# Patient Record
Sex: Female | Born: 1950
Health system: Southern US, Community
[De-identification: ages and names within clinical notes are randomized; demographics above are authoritative.]

## PROBLEM LIST (undated history)

## (undated) DIAGNOSIS — E119 Type 2 diabetes mellitus without complications: Secondary | ICD-10-CM

## (undated) DIAGNOSIS — H269 Unspecified cataract: Secondary | ICD-10-CM

## (undated) DIAGNOSIS — I2510 Atherosclerotic heart disease of native coronary artery without angina pectoris: Secondary | ICD-10-CM

## (undated) DIAGNOSIS — E785 Hyperlipidemia, unspecified: Secondary | ICD-10-CM

## (undated) DIAGNOSIS — M797 Fibromyalgia: Secondary | ICD-10-CM

## (undated) DIAGNOSIS — I10 Essential (primary) hypertension: Secondary | ICD-10-CM

## (undated) DIAGNOSIS — F321 Major depressive disorder, single episode, moderate: Secondary | ICD-10-CM

## (undated) DIAGNOSIS — E782 Mixed hyperlipidemia: Secondary | ICD-10-CM

## (undated) DIAGNOSIS — M62838 Other muscle spasm: Secondary | ICD-10-CM

## (undated) DIAGNOSIS — F5101 Primary insomnia: Secondary | ICD-10-CM

## (undated) DIAGNOSIS — E1165 Type 2 diabetes mellitus with hyperglycemia: Secondary | ICD-10-CM

## (undated) DIAGNOSIS — I119 Hypertensive heart disease without heart failure: Secondary | ICD-10-CM

## (undated) DIAGNOSIS — J309 Allergic rhinitis, unspecified: Secondary | ICD-10-CM

## (undated) DIAGNOSIS — F419 Anxiety disorder, unspecified: Secondary | ICD-10-CM

## (undated) DIAGNOSIS — E039 Hypothyroidism, unspecified: Secondary | ICD-10-CM

## (undated) DIAGNOSIS — R0789 Other chest pain: Secondary | ICD-10-CM

## (undated) DIAGNOSIS — R0602 Shortness of breath: Secondary | ICD-10-CM

## (undated) HISTORY — DX: Type 2 diabetes mellitus without complications: E11.9

## (undated) HISTORY — DX: Essential (primary) hypertension: I10

## (undated) HISTORY — PX: BREAST BIOPSY: SHX20

## (undated) HISTORY — PX: ABDOMINAL HYSTERECTOMY: SHX81

## (undated) HISTORY — DX: Major depressive disorder, single episode, moderate: F32.1

## (undated) HISTORY — PX: BREAST SURGERY: SHX581

## (undated) HISTORY — DX: Primary insomnia: F51.01

## (undated) HISTORY — DX: Hyperlipidemia, unspecified: E78.5

## (undated) HISTORY — DX: Fibromyalgia: M79.7

## (undated) HISTORY — DX: Shortness of breath: R06.02

## (undated) HISTORY — DX: Other muscle spasm: M62.838

## (undated) HISTORY — DX: Unspecified cataract: H26.9

## (undated) HISTORY — DX: Atherosclerotic heart disease of native coronary artery without angina pectoris: I25.10

## (undated) HISTORY — DX: Mixed hyperlipidemia: E78.2

## (undated) HISTORY — PX: EYE SURGERY: SHX253

## (undated) HISTORY — DX: Hypothyroidism, unspecified: E03.9

## (undated) HISTORY — DX: Allergic rhinitis, unspecified: J30.9

## (undated) HISTORY — DX: Other chest pain: R07.89

## (undated) HISTORY — DX: Type 2 diabetes mellitus with hyperglycemia: E11.65

## (undated) HISTORY — PX: CARDIAC CATHETERIZATION: SHX172

## (undated) HISTORY — DX: Anxiety disorder, unspecified: F41.9

## (undated) HISTORY — PX: OTHER SURGICAL HISTORY: SHX169

## (undated) HISTORY — DX: Hypertensive heart disease without heart failure: I11.9

---

## 1998-09-10 ENCOUNTER — Ambulatory Visit (HOSPITAL_COMMUNITY): Admission: RE | Admit: 1998-09-10 | Discharge: 1998-09-10 | Payer: Self-pay | Admitting: General Surgery

## 1998-09-10 ENCOUNTER — Encounter: Payer: Self-pay | Admitting: General Surgery

## 1998-09-20 ENCOUNTER — Ambulatory Visit (HOSPITAL_COMMUNITY): Admission: RE | Admit: 1998-09-20 | Discharge: 1998-09-20 | Payer: Self-pay | Admitting: General Surgery

## 1999-02-19 ENCOUNTER — Inpatient Hospital Stay (HOSPITAL_COMMUNITY): Admission: EM | Admit: 1999-02-19 | Discharge: 1999-02-21 | Payer: Self-pay | Admitting: Emergency Medicine

## 2003-10-09 ENCOUNTER — Inpatient Hospital Stay (HOSPITAL_COMMUNITY): Admission: EM | Admit: 2003-10-09 | Discharge: 2003-10-13 | Payer: Self-pay | Admitting: Emergency Medicine

## 2003-11-12 ENCOUNTER — Encounter: Admission: RE | Admit: 2003-11-12 | Discharge: 2003-11-12 | Payer: Self-pay | Admitting: Cardiology

## 2003-11-14 ENCOUNTER — Ambulatory Visit (HOSPITAL_COMMUNITY): Admission: RE | Admit: 2003-11-14 | Discharge: 2003-11-15 | Payer: Self-pay

## 2003-12-14 ENCOUNTER — Ambulatory Visit (HOSPITAL_COMMUNITY): Admission: RE | Admit: 2003-12-14 | Discharge: 2003-12-14 | Payer: Self-pay | Admitting: Pulmonary Disease

## 2006-09-16 ENCOUNTER — Ambulatory Visit (HOSPITAL_COMMUNITY): Admission: RE | Admit: 2006-09-16 | Discharge: 2006-09-16 | Payer: Self-pay | Admitting: Allergy

## 2006-10-19 ENCOUNTER — Other Ambulatory Visit: Admission: RE | Admit: 2006-10-19 | Discharge: 2006-10-19 | Payer: Self-pay | Admitting: Interventional Radiology

## 2006-10-19 ENCOUNTER — Encounter (INDEPENDENT_AMBULATORY_CARE_PROVIDER_SITE_OTHER): Payer: Self-pay | Admitting: *Deleted

## 2006-10-19 ENCOUNTER — Encounter: Admission: RE | Admit: 2006-10-19 | Discharge: 2006-10-19 | Payer: Self-pay | Admitting: General Surgery

## 2009-05-30 ENCOUNTER — Other Ambulatory Visit: Admission: RE | Admit: 2009-05-30 | Discharge: 2009-05-30 | Payer: Self-pay | Admitting: Radiology

## 2009-07-30 ENCOUNTER — Ambulatory Visit (HOSPITAL_COMMUNITY): Admission: RE | Admit: 2009-07-30 | Discharge: 2009-07-30 | Payer: Self-pay | Admitting: General Surgery

## 2009-07-30 ENCOUNTER — Encounter: Admission: RE | Admit: 2009-07-30 | Discharge: 2009-07-30 | Payer: Self-pay | Admitting: General Surgery

## 2010-08-17 ENCOUNTER — Encounter: Payer: Self-pay | Admitting: Allergy

## 2010-10-12 LAB — BASIC METABOLIC PANEL
BUN: 19 mg/dL (ref 6–23)
CO2: 26 mEq/L (ref 19–32)
Calcium: 9.3 mg/dL (ref 8.4–10.5)
Chloride: 111 mEq/L (ref 96–112)
Creatinine, Ser: 0.92 mg/dL (ref 0.4–1.2)
GFR calc Af Amer: >60 mL/min (ref >60)
GFR calc non Af Amer: >60 mL/min (ref >60)
Glucose, Bld: 109 mg/dL — ABNORMAL HIGH (ref 70–99)
Potassium: 4.7 mEq/L (ref 3.5–5.1)
Sodium: 141 mEq/L (ref 135–145)

## 2010-10-12 LAB — DIFFERENTIAL
Basophils Absolute: 0 10*3/uL (ref 0.0–0.1)
Basophils Relative: 0 % (ref 0–1)
Eosinophils Absolute: 0.3 10*3/uL (ref 0.0–0.7)
Eosinophils Relative: 5 % (ref 0–5)
Lymphocytes Relative: 30 % (ref 12–46)
Lymphs Abs: 2 10*3/uL (ref 0.7–4.0)
Monocytes Absolute: 0.4 10*3/uL (ref 0.1–1.0)
Monocytes Relative: 6 % (ref 3–12)
Neutro Abs: 4 10*3/uL (ref 1.7–7.7)
Neutrophils Relative %: 59 % (ref 43–77)

## 2010-10-12 LAB — CBC
HCT: 41.2 % (ref 36.0–46.0)
Hemoglobin: 14.5 g/dL (ref 12.0–15.0)
MCHC: 35.1 g/dL (ref 30.0–36.0)
MCV: 90.7 fL (ref 78.0–100.0)
Platelets: 178 10*3/uL (ref 150–400)
RBC: 4.55 MIL/uL (ref 3.87–5.11)
RDW: 13.1 % (ref 11.5–15.5)
WBC: 6.7 10*3/uL (ref 4.0–10.5)

## 2010-12-12 NOTE — H&P (Signed)
NAME:  Meagan Molina, [name]                            ACCOUNT NO.:  [id number]   MEDICAL RECORD NO.:  [id number]                   PATIENT TYPE:  INP   LOCATION:                                       FACILITY:   PHYSICIAN:  Edward L. Juanetta Gosling, M.D.             DATE OF BIRTH:  December 28, 1950   DATE OF ADMISSION:  10/09/2003  DATE OF DISCHARGE:  10/09/2003                                HISTORY & PHYSICAL   COMBINATION HISTORY AND PHYSICAL/ DISCHARGE SUMMARY, SHORT-STAY RECORD:   DISCHARGE DIAGNOSES:  1. Chest pain.  2. Coronary artery occlusive disease.  3. Hypertension.  4. Hyperlipidemia.  5. Anxiety and depression.   HISTORY:  [name] is a patient that I have been seeing with a history of  hypertension, and who has been followed by [facility] for  what appears to be coronary artery occlusive disease.  She has been in her  usual state of fairly poor health at home with increasing stress.  She came  to the emergency room for evaluation, and was found to have chest discomfort  that was at least suggestive of coronary artery disease, but had cardiac  enzymes that were negative except for her troponin was slightly elevated.   PHYSICAL EXAMINATION:  GENERAL:  She appeared to be in some distress.  CHEST:  Showed some rhonchi bilaterally.  HEART:  Regular.  ABDOMEN:  Soft.  VITAL SIGNS:  Blood pressure 159/127 initially, pulse 86, respirations 24,  O2 saturations 100%.   HOSPITAL COURSE:  She was started on Lovenox but continued to have chest  pain.  Plans were made to move her to the intensive care unit and start her  on nitroglycerin when a bed became available at [facility], and she  was transferred there.  This had been the original plan to transfer to [facility] from the emergency room, but no beds were available, so a transfer  could not be made at that time.  She was transferred to the [facility] in fair condition and still with chest  pain and the concern  that she might have some sort of ongoing cardiac event.   PLANS:  At least tentatively to have her undergo a cardiac catheterization.     ___________________________________________                                         Oneal Deputy Juanetta Gosling, M.D.   ELH/MEDQ  D:  10/09/2003  T:  10/09/2003  Job:  [id number]

## 2010-12-12 NOTE — Discharge Summary (Signed)
NAME:  Meagan Molina, Meagan Molina                            ACCOUNT NO.:  [id number]   MEDICAL RECORD NO.:  [id number]                   PATIENT TYPE:  INP   LOCATION:  2025                                 FACILITY:  [facility]   PHYSICIAN:  Madaline Savage, M.D.             DATE OF BIRTH:  1950-08-20   DATE OF ADMISSION:  10/09/2003  DATE OF DISCHARGE:  10/13/2003                                 DISCHARGE SUMMARY   FINAL DIAGNOSES:  1. An acute inferior myocardial infarction.  2. Coronary artery disease with emergent percutaneous transluminal coronary     angioplasty stent to the left circumflex. The patient has residual     disease of left anterior descending stenosis.  3. Left ventricular dysfunction, ejection fraction of 45% to 50%.  4. Hypertension.  5. Hyperlipidemia.  6. Diabetes mellitus type 2, new diagnosis.  7. Fibromyalgia.  8. Anxiety, depression.   CONDITION ON DISCHARGE:  Improved.   PROCEDURE:  1. October 09, 2003 combined left heart catheterization by Dr. Yates Decamp.  2. October 09, 2003 PTCA and stent deployment of the Cypher stent to the     circumflex by Dr. Yates Decamp.   DISCHARGE MEDICATIONS:  1. Coreg 3.125 one twice a day for blood pressure and heart.  2. Avandia 4 mg one twice a day.  3. Glyburide 2.5 mg twice a day.  4. Tricor 48 mg daily.  5. Enteric coated aspirin 81 mg daily.  6. Plavix 75 mg one daily, must take for 6 months or stent may be blocked.  7. Prevacid 30 mg twice a day as before.  8. Lipitor 40 mg daily.  9. Estratab.  Discuss with your primary physician concerning need to     continue.  10.      Nitroglycerin 1/150 sublingual p.r.n. chest pain.   DISCHARGE INSTRUCTIONS:  1. No strenuous activity, no lifting over 10 pounds for three weeks, no     driving for one week and no work.  2. Low fat, low salt diabetic diet.  3. Wash cath site with soap and water, call if any bleeding, swelling or     drainage.  4. Expect to receive a telephone call to  arrange outpatient diabetic     education at [facility].  5. Follow up with Dr. Jacinto Halim in one week.  The office will call you Monday     for an appointment date and time.  Follow up with Dr. Juanetta Gosling concerning     your diabetes.   HISTORY OF PRESENT ILLNESS:  This is a 60 year old white female, history of  chest pain and catheterization in 1987 by Dr. [name] which was normal,  again in July, 2000.  Dr. Allyson Sabal did a cardiac cath revealing a 70% LAD  lesion.  She has a history of hypertension and hyperlipidemia.   In August, 2004, Cardiolite was done and Dr. Allyson Sabal felt  there was no  significant ischemia.   The patient developed some chest pain on October 08, 2003 when she was  visiting with her father who had had a recent stroke.  Then, on the morning  of October 09, 2003 she woke at 3 a.m. with substernal chest pain,  diaphoresis, nausea and shortness of breath.  She presented to the emergency  room where EKG showed no acute changes.  Her troponin was positive at 0.07.  Her symptoms are somewhat improved with nitroglycerin.  She was transferred  from [facility] to [facility] for further evaluation.   PAST MEDICAL HISTORY:  1. Cardiac as described.  2. Hypertension.  3. Fibromyalgia.  4. Hyperlipidemia.  5. Anxiety and depression.  6. Status post total abdominal hysterectomy.  7. History of goiter by thyroid scan in 2000.   CURRENT MEDICATIONS:  Aspirin 325 daily, Prevacid b.i.d., Estratest daily  and Lipitor 40 daily.   ALLERGIES:  SULFA.   SOCIAL HISTORY:  Please see history and physical.   FAMILY HISTORY:  Please see history and physical.   REVIEW OF SYSTEMS:  Please see history and physical.   PHYSICAL EXAMINATION:  VITAL SIGNS: Blood pressure initially is 86/52,  but  at discharge she was 98/62.  Pulse is 80, temperature 99.2. HEART:  Regular  rate and rhythm, S1 and S2.  CHEST:  Clear.  ABDOMEN: Soft and nontender.   EXTREMITIES:  Without hematoma.  On telemetry, sinus rhythm.   LABORATORY DATA:  Admitting labs, hemoglobin 16.1, hematocrit 46.7,  platelets 264.  WBC 9.4. Prior to discharge WBC 10.2, platelets 234.  Hemoglobin is 14.5.   Pro time on admission PT is 13.2, INR 1 and PTT 30.   Chemistries sodium 137, potassium 3.9, chloride 102, CO2 24, glucose 326,  BUN 12, creatinine 0.9, total bilirubin 0.7, direct bilirubin is 0.1,  indirect bilirubin is 0.6, alkaline phosphatase 120, SGOT 34, SGPT 40, total  protein 7.3, albumin 3.9, calcium 9.6.  At the time of discharge, glucose  182, sodium 134, potassium 3.6.   Glycohemoglobin is 8.1.   Cardiac enzymes, initial CK MB of 111 and MB of 2.7, troponin I of 0.07.  The second is 278 and MB 40 and troponin 0.59.  Peak CK MB on March 16th at  10:04 was CK 4883 and MB 670, troponin was greater than 100.  By March 17th  CK was 1308 and MB 79.4.   Cholesterol 148, triglycerides 266, HDL 28 and LDL 67.  TSH was 1.674.   Chest x-ray October 09, 2003 was no acute disease.   EKG: Sinus rhythm, junctional ST depression probably normal and the patient  continues with pain.   HOSPITAL COURSE:  Meagan Molina was admitted by Dr. Jacinto Halim on call for  Dr. Allyson Sabal October 09, 2003 with chest pain.  She had been seen at [facility] and then was transported to [facility]. On arrival, symptoms had  improved with nitroglycerin.  Glucose was found to be elevated and she was  put on a sliding scale on admission.   By 10 p.m. that evening, she developed nausea.  Troponin was elevated and  she was seen and evaluated by Dr. Effie Shy on call and felt that due to some  continued chest pain, though EKG did not show a significant ST elevation,  that cardiac cath was needed to further evaluate the patient.  Dr. Jacinto Halim  took the patient to the cath lab and found stenosis of  the circumflex, 99%, thrombolytic.  She underwent stent deployment.  She also was found to have   continued disease of the LAD.   She tolerated the procedure and went to ICU and by the morning of October 10, 2003 was feeling better, had no further chest pain and no specific  complaints.  She was seen by Diabetes Management and Avandia as well as  Glyburide were added to her medical regimen.   The patient continued to slowly improve.  Cardiac rehab had her walk and  will start the outpatient cardiac rehab program once the second stent is  done or unless Dr. Jacinto Halim feels she could go ahead and begin that program.  On October 12, 2003 the plan was to discharge her but, unfortunately, her  sugar was not yet controlled and the patient did not know how to use the  Glucometer and blood pressure was somewhat low.  Her Altace was discontinued  and by the morning of October 13, 2003 the Coreg was also held, blood pressure  came back up and she would follow up with Dr. Jacinto Halim in a week.      Darcella Gasman. [place], N.P.                     Madaline Savage, M.D.    LRI/MEDQ  D:  10/13/2003  T:  10/16/2003  Job:  [id number]   cc:   Cristy Hilts. Jacinto Halim, M.D.  [address]  [place]  [place] [address]  Fax: [phone]   Oneal Deputy. Juanetta Gosling, M.D.  [address]  [place]  [place] [address]  Fax: [phone]

## 2010-12-12 NOTE — Cardiovascular Report (Signed)
NAME:  Meagan Molina, Meagan Molina                          ACCOUNT NO.:  [id number]   MEDICAL RECORD NO.:  [id number]                   PATIENT TYPE:  OIB   LOCATION:  6525                                 FACILITY:  [facility]   PHYSICIAN:  Cristy Hilts. Jacinto Halim, M.D.                  DATE OF BIRTH:  1951-05-28   DATE OF PROCEDURE:  11/14/2003  DATE OF DISCHARGE:                              CARDIAC CATHETERIZATION   PROCEDURE PERFORMED:  1. Cutting balloon angioplasty of the mid left anterior descending artery.  2. Right femoral angiography and closure of right femoral arterial access     with Angio-Seal.   INDICATION:  Meagan Molina is a 60 year old Caucasian female with history  of coronary artery disease who has had recent non-Q-wave myocardial  infarction.  She had undergone percutaneous transluminal coronary  angioplasty and stenting of her proximal circumflex coronary artery with a  3.5 x 13-mm Cypher on October 10, 2003.  She had eccentric mid LAD 80%  stenosis.  Given this, she was brought to the cardiac catheterization lab  for elective PCI of the LAD.  Of note, the patient has had recurrent  exertional angina pectoris and previously has had a Cardiolite stress test  which had shown anteroapical ischemia.   The patient was enrolled in [facility] trial with use of Factor X-a inhibitor  otamixaban ([id number]).   IMPRESSION:  1. Widely patent circumflex coronary artery stent in the proximal circumflex     coronary artery.  The obtuse marginal has 10-20% luminal irregularity.  2. Successful percutaneous transluminal coronary angioplasty with a 2.25 x     10-mm cutting balloon performed in the mid left anterior descending.  The     stenosis was reduced from 80% to 0% with TIMI-3 to TIMI-3 flow maintained     at the end of the procedure.  No evidence of thrombus or dissection noted     at the end of the procedure.   RECOMMENDATIONS:  The patient will be continued on aggressive risk factor  modification.  The patient will be discharged home if she remains stable in  the morning.   TECHNIQUE OF PROCEDURE:  Under usual sterile precautions, using a 7 French  right femoral arterial access, a 7 [place] FL-4 guide was utilized to engage  the left main coronary artery.  Then, the research drug otamixaban, which is  a Factor X-a inhibitor, was administered.  The research protocol was  followed for maintenance of the ACT.  Then, a 190 cm x 0.014 inch ATW guide  wire was utilized to cross into the left anterior descending artery.  Then,  a 2.25 x 10-mm cutting balloon was utilized to perform multiple  atherotomies.  The maximum pressure was at 7 atmospheric pressure performed  for a maximum of 150 seconds.  After performing multiple cuts, the balloon  was deflated, pulled back in the guiding catheter.  200  mcg of intracoronary  nitroglycerin was administered at least a couple times and angiography was  repeated.  Excellent stent-like results were noted.  After confirming the  success, the guide wire was withdrawn out of the body and angiography was  repeated.  Then, the guide catheter was  disengaged and pulled out of the body in the usual fashion.  Right femoral  angiography was performed through the arterial access sheath and access was  closed with Angio-Seal.  Excellent hemostasis was obtained.  The patient was  then transferred to the recovery in a stable condition.  The patient  tolerated the procedure well.                                               Cristy Hilts. Jacinto Halim, M.D.    Pilar Plate  D:  11/14/2003  T:  11/15/2003  Job:  [id number]   cc:   Ramon Dredge L. Juanetta Gosling, M.D.  [address]  [place]  [place] [address]  Fax: [phone]

## 2010-12-12 NOTE — H&P (Signed)
NAME:  Meagan Molina, Meagan Molina                            ACCOUNT NO.:  [id number]   MEDICAL RECORD NO.:  [id number]                   PATIENT TYPE:  INP   LOCATION:  2025                                 FACILITY:  [facility]   PHYSICIAN:  Cristy Hilts. Jacinto Halim, M.D.                  DATE OF BIRTH:  [birth year]   DATE OF ADMISSION:  10/09/2003  DATE OF DISCHARGE:                                HISTORY & PHYSICAL   CHIEF COMPLAINT:  Chest pain.   HISTORY OF PRESENT ILLNESS:  The patient is a 60 year old female with a  history of chest pain.  She had a catheterization in 1987 by Dr. Pearletha Furl.  Meagan Molina which was normal.  She was catheterized again in July of 2000 by  Dr. Allyson Sabal, and this showed a 70% LAD lesion.  She has a history of  hypertension and hyperlipidemia.  Cardiolite was done in August of 2004, and  Dr. Allyson Sabal felt there was no significant ischemia.  The patient noted some  chest pain yesterday when she was visiting with her father who had a stroke  recently.  This morning, she woke up at 3 a.m. with substernal chest pain,  diaphoresis, nausea, and shortness of breath.  She presented to the  emergency room at [facility] where her EKG showed no acute changes.  Her troponin is positive at 0.07.  Her symptoms are somewhat improved with  nitroglycerin.   PAST MEDICAL HISTORY:  Remarkable for hypertension.  She has a history of  fibromyalgia.  She has hyperlipidemia.  She has a history of anxiety and  depression.  She is status post total abdominal hysterectomy in the past.  She has had a history of a goiter by thyroid scan in 2000.   CURRENT MEDICATIONS:  1. Aspirin 325 mg a day.  2. Prevacid b.i.d.  3. Estratest daily.  4. Lipitor 40 mg a day.   ALLERGIES:  SULFA.   SOCIAL HISTORY:  She is married.  She has two step sons.  She is a  nonsmoker.   FAMILY HISTORY:  Remarkable for coronary disease.  Her father had a  myocardial infarction in the 18's and apparently was one of the first  patient catheterized in [place] by Dr. Pearletha Furl. Meagan Molina.  Mother died  of breast cancer in her 64's.   REVIEW OF SYSTEMS:  Recently remarkably for flu syndrome.  She says she  was basically in bed for the last three weeks.  She denies any hemoptysis.  There is no history of deep vein thrombosis.  She denies any kidney disease  or liver trouble.  She has not had GI bleeding or peptic ulcer disease.   PHYSICAL EXAMINATION:  VITAL SIGNS:  Blood pressure 147/100, pulse 78,  respirations 16.  GENERAL:  She is a well-developed, obese female, in no acute distress.  HEENT:  Normocephalic.  Extraocular movements are intact.  Sclerae is  nonicteric.  Lids and conjunctivae are within normal limits.  NECK:  Without bruits, without JVD.  CHEST:  Clear to auscultation and percussion.  CARDIAC:  Exam reveals a regular rate and rhythm without murmur, rub or  gallop.  Normal S1 and S2.  ABDOMEN:  Obese, no hepatosplenomegaly.  EXTREMITIES:  Without edema.  PULSES:  The pulses are 2+/4 no bruits.  NEUROLOGIC:  Exam is grossly intact.   LABORATORY DATA:  White count 9.4, hemoglobin 16.1, hematocrit 46.7,  platelets 264, sodium 137, potassium 3.0, BUN 12, creatinine 0.9.  Glucose  was 326 on a non-fasting sample.  Her CK was negative.  Troponin is positive  at 0.07.  INR 1.0.  Portable chest x-ray shows no active disease.   EKG shows sinus rhythm without acute changes.   IMPRESSION:  1. Unstable angina.  2. Known coronary disease with a 60% LAD lesion in 2002.  3. Hyperlipidemia.  Her LDL was 163 in December of 2004.  4. Uncontrolled hypertension.  5. Elevated blood sugar.  6. Obesity.  7. Fibromyalgia.  8. Anxiety and depression.   PLAN:  1. The patient is admitted to telemetry and started on IV nitrates and IV     heparin.  2. She will need restudying.  If her catheterization is negative, we may     want to consider a spiral CT with her recent history of three weeks of     bed  rest.  3. We will add low-dose beta-blocker, ACE inhibitor and check hemoglobin     A1c, and a fasting blood sugar.      Abelino Derrick, P.A.                      Cristy Hilts. Jacinto Halim, M.D.    Lenard Lance  D:  10/09/2003  T:  10/11/2003  Job:  [id number]   cc:   Ramon Dredge L. Juanetta Gosling, M.D.  [address]  [place]  [place] [address]  Fax: [phone]

## 2010-12-12 NOTE — Cardiovascular Report (Signed)
NAME:  Meagan Molina, Meagan Molina                            ACCOUNT NO.:  [id number]   MEDICAL RECORD NO.:  [id number]                   PATIENT TYPE:  INP   LOCATION:  2025                                 FACILITY:  [facility]   PHYSICIAN:  Cristy Hilts. Jacinto Halim, M.D.                  DATE OF BIRTH:  06-11-1951   DATE OF PROCEDURE:  10/10/2003  DATE OF DISCHARGE:                              CARDIAC CATHETERIZATION   ATTENDING CARDIOLOGIST:  Nanetta Batty, M.D.   PROCEDURE PERFORMED:  1. Left ventriculography.  2. Selective right and left coronary arteriography.  3. Percutaneous transluminal coronary angioplasty and stenting of the     proximal circumflex coronary artery.  4. Intracoronary nitroglycerin administration and adjunct integrilin and     heparin for anticoagulation.  5. Right femoral angiography and closure of right femoral arterial access     with Angio-Seal.   INDICATION:  [name] is a 60 year old Caucasian female with history  of hypertension, hyperlipidemia who was admitted to the hospital with chest  pain suggestive of unstable angina.  While she was on the floor, she  developed recurrent chest pain with increasing CPK enzymes and given this  she was brought to the cardiac catheterization on an emergent basis for  coronary angiography and possible PCI.   HEMODYNAMIC DATA:  1. The left ventricular pressures were 123/5 with end-diastolic pressure of     .  2. Aortic pressure 113/80 with a mean of 95 mmHg.  3. There was no pressure gradient across the aortic valve.   ANGIOGRAPHIC DATA:  Left ventricle:  Left ventricular systolic function was  mildly depressed with ejection fraction of 45-50%.  There was lateral wall  akinesis.   Right coronary artery:  The right coronary artery is a very large caliber  vessel and is co-dominant to circumflex coronary artery.  Gives origin to a  very large PDA and a very small PLV.   Left main coronary artery:  Left main coronary artery  is a large caliber  vessel. It is normal.   Circumflex coronary artery:  Circumflex coronary artery is a very large  caliber vessel and co-dominant with right coronary artery.  The proximal  circumflex coronary artery has a 99% stenosis and has thrombus burden.  The  circumflex gives origin to a very large obtuse marginal one which has 40-50%  lesion in its mid segment.  This obtuse marginal supplies large part of the  lateral wall.   Ramus intermediate:  Ramus intermediate is a small vessel and is normal.   Left anterior descending artery:  Left anterior descending artery is a large  caliber vessel in a proximal segment.  It gives origin to a very large  diagonal one.  In the mid LAD has an eccentric 80-85% stenosis previously  noted to be around 60% on February 21, 1999.  The distal LAD has mild disease.  INTERVENTION DATA:  Successful percutaneous transluminal coronary  angioplasty and stenting of the proximal circumflex coronary artery with a  3.5 x 13-mm Cypher stent deployed at 16 atmospheric pressure.  This was post  dilated with a 3.5 x 8-mm PowerSail at 16 atmospheric pressure maximum.  The  stenosis was overall reduced from 99% to 0% with TIMI-2 to 3 flow maintained  at the end of the procedure with no evidence of thrombus or dissection at  the end of the procedure.   RECOMMENDATIONS:  1. Aggressive risk factor modification is indicated.  Will try to have the     LDL goal set for 70.  The blood sugars have been elevated.  Will followup     with the blood sugars and evaluate her for new onset of diabetes.  2. She will need PCI of the mid LAD on an elective basis probably in two to     three weeks time.  At that time, we can also relook at the obtuse     marginal lesion which is about 50%.  3. Integrilin will be continued for 18-24 hours.   TECHNIQUE OF PROCEDURE:  Diagnostic cardiac catheterization.  Under usual  sterile precautions, using a 6 French right femoral arterial  access, a 6  [place] multipurpose pigtail catheter was advanced into the ascending aorta  over a 0.035-inch J wire.  The catheter was then gently advanced to the left  ventricle and left ventricular pressures were monitored.  Hand contrast  injection of the left ventricle was performed both in the LAO and RAO  projection.  The catheter was flushed with saline and pulled back into the  ascending aorta and pressure gradient across the aortic valve was monitored.  The right coronary artery was selectively engaged and angiography was  performed.  Then, the catheter was pulled out of the body in the usual  fashion and a 6 French Judkins left 4 diagnostic catheter was utilized to  engage the left main coronary artery and angiography was repeated.   TECHNIQUE OF INTERVENTION:  A 6 French sheath was exchanged to a 7 [place]  sheath.  Then, a 7 [place] FL-4 guide was utilized to engage the left main  coronary artery and after giving additional 3000 units of heparin and  maintaining the ACT of greater than 250 along with integrilin a 190 cm x  0.014 inch ATW marker guide wire was utilized to cross into the circumflex  coronary artery and the lesion length was measured.  Then, a 2.75 x 20-mm  Maverick balloon was utilized for predilatation at 9 atmospheric pressure  for 61 seconds.  Angiography was repeated.  Then, a 3.5 x 13-mm Cypher stent  was utilized for stenting at the lesion.  The stent was deployed at 16  atmospheric pressure for 72 seconds.  Then, the balloon was deflated and  pulled back into the guiding catheter.  Angiography was repeated.  Because  of waist in the mid segment, a 3.5 x 8-mm PowerSail balloon was utilized and  two inflations of 14 and 16 atmospheric pressure was performed within the  stent for 40 seconds.  Then, the balloons were deflated and pulled back in  the guiding catheter.  200 mcg of intracoronary nitroglycerin was administered.  Angiography was repeated.  Excellent  results were noted.  The  obtuse marginal lesion did not appear to be significant; hence, it was left  alone.  After confirming the success, additional views were taken to look at  the LAD stenosis that was previously noted.  The guide wire was withdrawn  prior to performing the angiographic views of the LAD.  Then, the guide  catheter was disengaged from the left main coronary artery and pulled out of  the body over the 0.035-inch J-wire.  The patient tolerated the procedure  well.  No complications were noted.                                               Cristy Hilts. Jacinto Halim, M.D.    Pilar Plate  D:  10/10/2003  T:  10/11/2003  Job:  [id number]   cc:   Nanetta Batty, M.D.  Fax: [phone]

## 2011-11-04 DIAGNOSIS — K2100 Gastro-esophageal reflux disease with esophagitis, without bleeding: Secondary | ICD-10-CM | POA: Diagnosis not present

## 2011-11-04 DIAGNOSIS — I10 Essential (primary) hypertension: Secondary | ICD-10-CM | POA: Diagnosis not present

## 2011-11-04 DIAGNOSIS — E785 Hyperlipidemia, unspecified: Secondary | ICD-10-CM | POA: Diagnosis not present

## 2011-11-04 DIAGNOSIS — E039 Hypothyroidism, unspecified: Secondary | ICD-10-CM | POA: Diagnosis not present

## 2011-11-13 DIAGNOSIS — E039 Hypothyroidism, unspecified: Secondary | ICD-10-CM | POA: Diagnosis not present

## 2011-11-13 DIAGNOSIS — E785 Hyperlipidemia, unspecified: Secondary | ICD-10-CM | POA: Diagnosis not present

## 2011-11-13 DIAGNOSIS — I10 Essential (primary) hypertension: Secondary | ICD-10-CM | POA: Diagnosis not present

## 2011-11-13 DIAGNOSIS — IMO0001 Reserved for inherently not codable concepts without codable children: Secondary | ICD-10-CM | POA: Diagnosis not present

## 2012-01-14 DIAGNOSIS — R0602 Shortness of breath: Secondary | ICD-10-CM | POA: Diagnosis not present

## 2012-01-14 DIAGNOSIS — I10 Essential (primary) hypertension: Secondary | ICD-10-CM | POA: Diagnosis not present

## 2012-01-14 DIAGNOSIS — E782 Mixed hyperlipidemia: Secondary | ICD-10-CM | POA: Diagnosis not present

## 2012-01-14 DIAGNOSIS — I2510 Atherosclerotic heart disease of native coronary artery without angina pectoris: Secondary | ICD-10-CM | POA: Diagnosis not present

## 2012-02-03 ENCOUNTER — Encounter: Payer: Self-pay | Admitting: Cardiovascular Disease

## 2012-02-03 DIAGNOSIS — R0609 Other forms of dyspnea: Secondary | ICD-10-CM | POA: Diagnosis not present

## 2012-02-03 DIAGNOSIS — R0602 Shortness of breath: Secondary | ICD-10-CM | POA: Diagnosis not present

## 2012-02-03 DIAGNOSIS — I10 Essential (primary) hypertension: Secondary | ICD-10-CM | POA: Diagnosis not present

## 2012-02-03 DIAGNOSIS — R0989 Other specified symptoms and signs involving the circulatory and respiratory systems: Secondary | ICD-10-CM | POA: Diagnosis not present

## 2012-02-03 DIAGNOSIS — I2510 Atherosclerotic heart disease of native coronary artery without angina pectoris: Secondary | ICD-10-CM | POA: Diagnosis not present

## 2012-02-04 ENCOUNTER — Ambulatory Visit (HOSPITAL_COMMUNITY)
Admission: RE | Admit: 2012-02-04 | Discharge: 2012-02-04 | Disposition: A | Discharge status: Home / Self Care | Payer: Medicare Other | Source: Ambulatory Visit | Attending: Pulmonary Disease | Admitting: Pulmonary Disease

## 2012-02-04 ENCOUNTER — Other Ambulatory Visit (HOSPITAL_COMMUNITY): Payer: Self-pay | Admitting: Pulmonary Disease

## 2012-02-04 DIAGNOSIS — Z0000 Encounter for general adult medical examination without abnormal findings: Secondary | ICD-10-CM | POA: Diagnosis not present

## 2012-02-04 DIAGNOSIS — R0602 Shortness of breath: Secondary | ICD-10-CM | POA: Diagnosis not present

## 2012-02-04 DIAGNOSIS — E109 Type 1 diabetes mellitus without complications: Secondary | ICD-10-CM | POA: Diagnosis not present

## 2012-02-04 DIAGNOSIS — E785 Hyperlipidemia, unspecified: Secondary | ICD-10-CM | POA: Diagnosis not present

## 2012-02-04 DIAGNOSIS — E039 Hypothyroidism, unspecified: Secondary | ICD-10-CM | POA: Diagnosis not present

## 2012-02-26 ENCOUNTER — Other Ambulatory Visit: Payer: Self-pay | Admitting: Cardiovascular Disease

## 2012-02-26 ENCOUNTER — Encounter (HOSPITAL_COMMUNITY): Payer: Self-pay | Admitting: Pharmacy Technician

## 2012-02-26 DIAGNOSIS — Z79899 Other long term (current) drug therapy: Secondary | ICD-10-CM | POA: Diagnosis not present

## 2012-02-26 DIAGNOSIS — N390 Urinary tract infection, site not specified: Secondary | ICD-10-CM | POA: Diagnosis not present

## 2012-02-26 DIAGNOSIS — D65 Disseminated intravascular coagulation [defibrination syndrome]: Secondary | ICD-10-CM | POA: Diagnosis not present

## 2012-03-01 ENCOUNTER — Ambulatory Visit (HOSPITAL_COMMUNITY)
Admission: RE | Admit: 2012-03-01 | Discharge: 2012-03-01 | Disposition: A | Discharge status: Home / Self Care | Payer: Medicare Other | Source: Ambulatory Visit | Attending: Cardiovascular Disease | Admitting: Cardiovascular Disease

## 2012-03-01 ENCOUNTER — Encounter (HOSPITAL_COMMUNITY)
Admission: RE | Disposition: A | Discharge status: Home / Self Care | Payer: Self-pay | Source: Ambulatory Visit | Attending: Cardiovascular Disease

## 2012-03-01 DIAGNOSIS — Z8249 Family history of ischemic heart disease and other diseases of the circulatory system: Secondary | ICD-10-CM | POA: Diagnosis not present

## 2012-03-01 DIAGNOSIS — R0989 Other specified symptoms and signs involving the circulatory and respiratory systems: Secondary | ICD-10-CM | POA: Diagnosis not present

## 2012-03-01 DIAGNOSIS — I10 Essential (primary) hypertension: Secondary | ICD-10-CM | POA: Insufficient documentation

## 2012-03-01 DIAGNOSIS — R0609 Other forms of dyspnea: Secondary | ICD-10-CM | POA: Insufficient documentation

## 2012-03-01 DIAGNOSIS — E785 Hyperlipidemia, unspecified: Secondary | ICD-10-CM | POA: Diagnosis not present

## 2012-03-01 DIAGNOSIS — E663 Overweight: Secondary | ICD-10-CM | POA: Insufficient documentation

## 2012-03-01 DIAGNOSIS — R9430 Abnormal result of cardiovascular function study, unspecified: Secondary | ICD-10-CM | POA: Diagnosis not present

## 2012-03-01 DIAGNOSIS — R9439 Abnormal result of other cardiovascular function study: Secondary | ICD-10-CM | POA: Insufficient documentation

## 2012-03-01 DIAGNOSIS — Z9861 Coronary angioplasty status: Secondary | ICD-10-CM | POA: Insufficient documentation

## 2012-03-01 DIAGNOSIS — E119 Type 2 diabetes mellitus without complications: Secondary | ICD-10-CM | POA: Insufficient documentation

## 2012-03-01 DIAGNOSIS — I252 Old myocardial infarction: Secondary | ICD-10-CM | POA: Insufficient documentation

## 2012-03-01 DIAGNOSIS — I2510 Atherosclerotic heart disease of native coronary artery without angina pectoris: Secondary | ICD-10-CM | POA: Insufficient documentation

## 2012-03-01 HISTORY — PX: LEFT HEART CATHETERIZATION WITH CORONARY ANGIOGRAM: SHX5451

## 2012-03-01 LAB — GLUCOSE, CAPILLARY: Glucose-Capillary: 83 mg/dL (ref 70–99)

## 2012-03-01 SURGERY — LEFT HEART CATHETERIZATION WITH CORONARY ANGIOGRAM
Anesthesia: LOCAL

## 2012-03-01 MED ORDER — VERAPAMIL HCL 2.5 MG/ML IV SOLN
INTRAVENOUS | Status: AC
  Filled 2012-03-01: qty 2

## 2012-03-01 MED ORDER — ASPIRIN 81 MG PO CHEW
324.0000 mg | CHEWABLE_TABLET | ORAL | Status: AC
  Administered 2012-03-01: 324 mg via ORAL

## 2012-03-01 MED ORDER — HEPARIN SODIUM (PORCINE) 1000 UNIT/ML IJ SOLN
INTRAMUSCULAR | Status: AC
  Filled 2012-03-01: qty 1

## 2012-03-01 MED ORDER — SODIUM CHLORIDE 0.9 % IV SOLN
INTRAVENOUS | Status: DC
  Administered 2012-03-01: 1000 mL via INTRAVENOUS

## 2012-03-01 MED ORDER — SODIUM CHLORIDE 0.9 % IV SOLN: INTRAVENOUS | Status: DC

## 2012-03-01 MED ORDER — SODIUM CHLORIDE 0.9 % IJ SOLN: 3.0000 mL | Freq: Two times a day (BID) | INTRAMUSCULAR | Status: DC

## 2012-03-01 MED ORDER — ONDANSETRON HCL 4 MG/2ML IJ SOLN: 4.0000 mg | Freq: Four times a day (QID) | INTRAMUSCULAR | Status: DC | PRN

## 2012-03-01 MED ORDER — NITROGLYCERIN 0.2 MG/ML ON CALL CATH LAB
INTRAVENOUS | Status: AC
  Filled 2012-03-01: qty 1

## 2012-03-01 MED ORDER — MORPHINE SULFATE 4 MG/ML IJ SOLN: 1.0000 mg | INTRAMUSCULAR | Status: DC | PRN

## 2012-03-01 MED ORDER — ACETAMINOPHEN 325 MG PO TABS: 650.0000 mg | ORAL_TABLET | ORAL | Status: DC | PRN

## 2012-03-01 MED ORDER — ASPIRIN 81 MG PO CHEW
CHEWABLE_TABLET | ORAL | Status: AC
  Administered 2012-03-01: 324 mg via ORAL
  Filled 2012-03-01: qty 4

## 2012-03-01 MED ORDER — HEPARIN (PORCINE) IN NACL 2-0.9 UNIT/ML-% IJ SOLN
INTRAMUSCULAR | Status: AC
  Filled 2012-03-01: qty 2000

## 2012-03-01 MED ORDER — SODIUM CHLORIDE 0.9 % IV SOLN: 250.0000 mL | INTRAVENOUS | Status: DC | PRN

## 2012-03-01 MED ORDER — SODIUM CHLORIDE 0.9 % IJ SOLN: 3.0000 mL | INTRAMUSCULAR | Status: DC | PRN

## 2012-03-01 MED ORDER — LIDOCAINE HCL (PF) 1 % IJ SOLN
INTRAMUSCULAR | Status: AC
  Filled 2012-03-01: qty 30

## 2012-03-01 NOTE — H&P (Signed)
  H & P will be scanned in.  Pt was reexamined and existing H & P reviewed. No changes found.  Runell Gess, MD [facility] 03/01/2012 4:50 PM

## 2012-03-01 NOTE — Op Note (Signed)
Meagan Molina is a 61 y.o. female    [id number] LOCATION:  FACILITY: [facility]  PHYSICIAN: Nanetta Batty, M.D. 04/08/1951   DATE OF PROCEDURE:  03/01/2012  DATE OF DISCHARGE:  [facility]  CARDIAC CATHETERIZATION     History obtained from chart review. Is a 61 year old mildly overweight married Caucasian female with no children history of CAD status post posterior wall myocardial infarction and April 2005 patient underwent PCI and stenting of the circumflex with a stent procedure of the LAD. Problems include hypertension, hyperlipidemia and non-insulin requiringdiabetes as well as family history. Stress test performed in March of 2001 revealed inferolateral scar without ischemia normal EF and posterior wall hypokinesia with moderate MR and Lasix when she developed progressive dyspnea despite having lost 25 pounds. A Myoview stress test was performed and showed subtle anterolateral ischemia. She presents now for outpatient diagnostic coronary arteriography to define anatomy and rule out ischemic etiology.   PROCEDURE DESCRIPTION:    The patient was brought to the second floor  [facility] lab in the postabsorptive state. She was  premedicated with Valium 5 mg by mouth. Her right wrist was prepped and shaved in usual sterile fashion. Xylocaine 1% was used  for local anesthesia. A 5 French sheath was inserted into the right radial  artery using standard Seldinger technique. The patient received  4000 units  of heparin  intravenously.  She received 10 cc of "radial cocktail". Using a 5 [place] TIG catheter along with a pigtail catheter selective angiography, left ventriculography was performed. Visipaque dye was used for the entirety of the case.  Retrograde aortic, left ventricular pressures were recorded.    HEMODYNAMICS:    AO SYSTOLIC/AO DIASTOLIC: 127/71   LV SYSTOLIC/LV DIASTOLIC: 134/13  ANGIOGRAPHIC RESULTS:   1. Left main; normal  2. LAD;  normal with a twin LAD 3. Left circumflex; nondominant and patent proximally the circumflex stent.  4. Right coronary artery; predominant rhythm percent stenosis at the "genu" of the vessel. 5. Left ventriculography; RAO left ventriculogram was performed using  25 mL of Visipaque dye at 12 mL/second. The overall LVEF estimated  60 %  Without wall motion abnormalities  IMPRESSION:[name] has a widely patent circumflex stent but otherwise noncritical CAD and a 50% stenosis in her codominant RCA. I do not think this is causing her symptoms. Continued medical therapy will be recommended.  The sheath was removed in the TR and was placed on the right wrist and inflated to achieve patent hemostasis. The patient left the lab in stable condition. She will be gently hydrated for several hours, discharged home as an outpatient and will see me  back in the office in 2-3 weeks for followup.  Runell Gess MD, [facility] 03/01/2012 5:18 PM

## 2012-03-10 DIAGNOSIS — I2510 Atherosclerotic heart disease of native coronary artery without angina pectoris: Secondary | ICD-10-CM | POA: Diagnosis not present

## 2012-03-10 DIAGNOSIS — R0602 Shortness of breath: Secondary | ICD-10-CM | POA: Diagnosis not present

## 2012-03-10 DIAGNOSIS — E782 Mixed hyperlipidemia: Secondary | ICD-10-CM | POA: Diagnosis not present

## 2012-03-10 DIAGNOSIS — I10 Essential (primary) hypertension: Secondary | ICD-10-CM | POA: Diagnosis not present

## 2012-03-12 DIAGNOSIS — R002 Palpitations: Secondary | ICD-10-CM | POA: Diagnosis not present

## 2012-03-22 DIAGNOSIS — R0602 Shortness of breath: Secondary | ICD-10-CM | POA: Diagnosis not present

## 2012-03-23 DIAGNOSIS — E785 Hyperlipidemia, unspecified: Secondary | ICD-10-CM | POA: Diagnosis not present

## 2012-03-23 DIAGNOSIS — E039 Hypothyroidism, unspecified: Secondary | ICD-10-CM | POA: Diagnosis not present

## 2012-03-23 DIAGNOSIS — I10 Essential (primary) hypertension: Secondary | ICD-10-CM | POA: Diagnosis not present

## 2012-03-23 DIAGNOSIS — E109 Type 1 diabetes mellitus without complications: Secondary | ICD-10-CM | POA: Diagnosis not present

## 2012-03-23 DIAGNOSIS — R0602 Shortness of breath: Secondary | ICD-10-CM | POA: Diagnosis not present

## 2012-03-23 DIAGNOSIS — I259 Chronic ischemic heart disease, unspecified: Secondary | ICD-10-CM | POA: Diagnosis not present

## 2012-03-29 LAB — FECAL OCCULT BLOOD, GUAIAC: Fecal Occult Blood: NEGATIVE

## 2012-04-15 DIAGNOSIS — I2510 Atherosclerotic heart disease of native coronary artery without angina pectoris: Secondary | ICD-10-CM | POA: Diagnosis not present

## 2012-04-15 DIAGNOSIS — E119 Type 2 diabetes mellitus without complications: Secondary | ICD-10-CM | POA: Diagnosis not present

## 2012-04-15 DIAGNOSIS — R0602 Shortness of breath: Secondary | ICD-10-CM | POA: Diagnosis not present

## 2012-04-15 DIAGNOSIS — E782 Mixed hyperlipidemia: Secondary | ICD-10-CM | POA: Diagnosis not present

## 2012-04-20 DIAGNOSIS — I10 Essential (primary) hypertension: Secondary | ICD-10-CM | POA: Diagnosis not present

## 2012-04-20 DIAGNOSIS — E785 Hyperlipidemia, unspecified: Secondary | ICD-10-CM | POA: Diagnosis not present

## 2012-04-20 DIAGNOSIS — I259 Chronic ischemic heart disease, unspecified: Secondary | ICD-10-CM | POA: Diagnosis not present

## 2012-04-20 DIAGNOSIS — E109 Type 1 diabetes mellitus without complications: Secondary | ICD-10-CM | POA: Diagnosis not present

## 2012-05-12 DIAGNOSIS — D235 Other benign neoplasm of skin of trunk: Secondary | ICD-10-CM | POA: Diagnosis not present

## 2012-05-12 DIAGNOSIS — L820 Inflamed seborrheic keratosis: Secondary | ICD-10-CM | POA: Diagnosis not present

## 2012-05-19 DIAGNOSIS — N6009 Solitary cyst of unspecified breast: Secondary | ICD-10-CM | POA: Diagnosis not present

## 2012-05-19 DIAGNOSIS — Z09 Encounter for follow-up examination after completed treatment for conditions other than malignant neoplasm: Secondary | ICD-10-CM | POA: Diagnosis not present

## 2012-05-30 DIAGNOSIS — Z23 Encounter for immunization: Secondary | ICD-10-CM | POA: Diagnosis not present

## 2012-05-30 DIAGNOSIS — I259 Chronic ischemic heart disease, unspecified: Secondary | ICD-10-CM | POA: Diagnosis not present

## 2012-05-30 DIAGNOSIS — E109 Type 1 diabetes mellitus without complications: Secondary | ICD-10-CM | POA: Diagnosis not present

## 2012-05-30 DIAGNOSIS — I10 Essential (primary) hypertension: Secondary | ICD-10-CM | POA: Diagnosis not present

## 2012-05-30 DIAGNOSIS — E785 Hyperlipidemia, unspecified: Secondary | ICD-10-CM | POA: Diagnosis not present

## 2012-07-11 DIAGNOSIS — E785 Hyperlipidemia, unspecified: Secondary | ICD-10-CM | POA: Diagnosis not present

## 2012-07-11 DIAGNOSIS — I10 Essential (primary) hypertension: Secondary | ICD-10-CM | POA: Diagnosis not present

## 2012-07-11 DIAGNOSIS — E109 Type 1 diabetes mellitus without complications: Secondary | ICD-10-CM | POA: Diagnosis not present

## 2012-07-11 DIAGNOSIS — I259 Chronic ischemic heart disease, unspecified: Secondary | ICD-10-CM | POA: Diagnosis not present

## 2012-07-26 DIAGNOSIS — E119 Type 2 diabetes mellitus without complications: Secondary | ICD-10-CM | POA: Diagnosis not present

## 2012-07-26 DIAGNOSIS — H2510 Age-related nuclear cataract, unspecified eye: Secondary | ICD-10-CM | POA: Diagnosis not present

## 2012-10-06 DIAGNOSIS — I259 Chronic ischemic heart disease, unspecified: Secondary | ICD-10-CM | POA: Diagnosis not present

## 2012-10-06 DIAGNOSIS — E785 Hyperlipidemia, unspecified: Secondary | ICD-10-CM | POA: Diagnosis not present

## 2012-10-06 DIAGNOSIS — I10 Essential (primary) hypertension: Secondary | ICD-10-CM | POA: Diagnosis not present

## 2012-10-06 DIAGNOSIS — E109 Type 1 diabetes mellitus without complications: Secondary | ICD-10-CM | POA: Diagnosis not present

## 2012-12-15 ENCOUNTER — Ambulatory Visit: Payer: Medicare Other | Admitting: Cardiovascular Disease

## 2012-12-16 ENCOUNTER — Ambulatory Visit: Payer: Medicare Other | Admitting: Cardiovascular Disease

## 2013-01-03 DIAGNOSIS — E039 Hypothyroidism, unspecified: Secondary | ICD-10-CM | POA: Diagnosis not present

## 2013-01-03 DIAGNOSIS — I259 Chronic ischemic heart disease, unspecified: Secondary | ICD-10-CM | POA: Diagnosis not present

## 2013-01-03 DIAGNOSIS — F329 Major depressive disorder, single episode, unspecified: Secondary | ICD-10-CM | POA: Diagnosis not present

## 2013-01-03 DIAGNOSIS — E785 Hyperlipidemia, unspecified: Secondary | ICD-10-CM | POA: Diagnosis not present

## 2013-01-03 DIAGNOSIS — I10 Essential (primary) hypertension: Secondary | ICD-10-CM | POA: Diagnosis not present

## 2013-01-03 DIAGNOSIS — E109 Type 1 diabetes mellitus without complications: Secondary | ICD-10-CM | POA: Diagnosis not present

## 2013-01-03 DIAGNOSIS — E119 Type 2 diabetes mellitus without complications: Secondary | ICD-10-CM | POA: Diagnosis not present

## 2013-01-03 DIAGNOSIS — F411 Generalized anxiety disorder: Secondary | ICD-10-CM | POA: Diagnosis not present

## 2013-01-05 DIAGNOSIS — F411 Generalized anxiety disorder: Secondary | ICD-10-CM | POA: Diagnosis not present

## 2013-01-05 DIAGNOSIS — E119 Type 2 diabetes mellitus without complications: Secondary | ICD-10-CM | POA: Diagnosis not present

## 2013-01-05 DIAGNOSIS — I259 Chronic ischemic heart disease, unspecified: Secondary | ICD-10-CM | POA: Diagnosis not present

## 2013-01-05 DIAGNOSIS — E039 Hypothyroidism, unspecified: Secondary | ICD-10-CM | POA: Diagnosis not present

## 2013-01-05 DIAGNOSIS — F329 Major depressive disorder, single episode, unspecified: Secondary | ICD-10-CM | POA: Diagnosis not present

## 2013-01-05 DIAGNOSIS — I10 Essential (primary) hypertension: Secondary | ICD-10-CM | POA: Diagnosis not present

## 2013-01-05 DIAGNOSIS — E785 Hyperlipidemia, unspecified: Secondary | ICD-10-CM | POA: Diagnosis not present

## 2013-04-06 DIAGNOSIS — R0602 Shortness of breath: Secondary | ICD-10-CM | POA: Diagnosis not present

## 2013-04-06 DIAGNOSIS — I10 Essential (primary) hypertension: Secondary | ICD-10-CM | POA: Diagnosis not present

## 2013-04-06 DIAGNOSIS — I259 Chronic ischemic heart disease, unspecified: Secondary | ICD-10-CM | POA: Diagnosis not present

## 2013-04-06 DIAGNOSIS — E039 Hypothyroidism, unspecified: Secondary | ICD-10-CM | POA: Diagnosis not present

## 2013-04-06 DIAGNOSIS — E109 Type 1 diabetes mellitus without complications: Secondary | ICD-10-CM | POA: Diagnosis not present

## 2013-05-11 DIAGNOSIS — Z23 Encounter for immunization: Secondary | ICD-10-CM | POA: Diagnosis not present

## 2013-07-11 DIAGNOSIS — E039 Hypothyroidism, unspecified: Secondary | ICD-10-CM | POA: Diagnosis not present

## 2013-07-11 DIAGNOSIS — E785 Hyperlipidemia, unspecified: Secondary | ICD-10-CM | POA: Diagnosis not present

## 2013-07-11 DIAGNOSIS — I259 Chronic ischemic heart disease, unspecified: Secondary | ICD-10-CM | POA: Diagnosis not present

## 2013-07-11 DIAGNOSIS — E109 Type 1 diabetes mellitus without complications: Secondary | ICD-10-CM | POA: Diagnosis not present

## 2013-07-12 DIAGNOSIS — I259 Chronic ischemic heart disease, unspecified: Secondary | ICD-10-CM | POA: Diagnosis not present

## 2013-07-12 DIAGNOSIS — F411 Generalized anxiety disorder: Secondary | ICD-10-CM | POA: Diagnosis not present

## 2013-07-12 DIAGNOSIS — E039 Hypothyroidism, unspecified: Secondary | ICD-10-CM | POA: Diagnosis not present

## 2013-07-12 DIAGNOSIS — E785 Hyperlipidemia, unspecified: Secondary | ICD-10-CM | POA: Diagnosis not present

## 2013-07-12 DIAGNOSIS — I10 Essential (primary) hypertension: Secondary | ICD-10-CM | POA: Diagnosis not present

## 2013-07-12 DIAGNOSIS — E109 Type 1 diabetes mellitus without complications: Secondary | ICD-10-CM | POA: Diagnosis not present

## 2013-07-22 LAB — TSH: TSH: 0.38 — AB (ref <=5.90)

## 2013-07-31 DIAGNOSIS — H2510 Age-related nuclear cataract, unspecified eye: Secondary | ICD-10-CM | POA: Diagnosis not present

## 2013-07-31 DIAGNOSIS — E119 Type 2 diabetes mellitus without complications: Secondary | ICD-10-CM | POA: Diagnosis not present

## 2013-10-04 DIAGNOSIS — E785 Hyperlipidemia, unspecified: Secondary | ICD-10-CM | POA: Diagnosis not present

## 2013-10-04 DIAGNOSIS — I259 Chronic ischemic heart disease, unspecified: Secondary | ICD-10-CM | POA: Diagnosis not present

## 2013-10-04 DIAGNOSIS — E109 Type 1 diabetes mellitus without complications: Secondary | ICD-10-CM | POA: Diagnosis not present

## 2013-10-04 DIAGNOSIS — I10 Essential (primary) hypertension: Secondary | ICD-10-CM | POA: Diagnosis not present

## 2013-10-19 ENCOUNTER — Encounter (HOSPITAL_COMMUNITY): Payer: Self-pay | Admitting: Dietician

## 2013-10-19 DIAGNOSIS — L820 Inflamed seborrheic keratosis: Secondary | ICD-10-CM | POA: Diagnosis not present

## 2013-10-19 DIAGNOSIS — D235 Other benign neoplasm of skin of trunk: Secondary | ICD-10-CM | POA: Diagnosis not present

## 2013-10-19 DIAGNOSIS — B079 Viral wart, unspecified: Secondary | ICD-10-CM | POA: Diagnosis not present

## 2013-10-19 NOTE — Progress Notes (Signed)
[facility] Diabetes Class Completion  Date:October 19, 2013  Time: 1410  Pt attended [facility]'s Diabetes Group Education Class on October 19, 2013.   Patient was educated on the following topics:   -Survival skills (signs and symptoms of hyperglycemia and hypoglycemia, treatment for hypoglycemia, ideal levels for fasting and postprandial blood sugars, goal Hgb A1c level, foot care basics)  -Recommendations for physical activity   -Carbohydrate metabolism in relation to diabetes   -Meal planning (sources of carbohydrate, carbohydrate counting, meal planning strategies, food label reading, and portion control).  Handouts provided:  -"Diabetes and You: Taking Charge of Your Health"  -"Carbohydrate Counting and Meal Planning"  -"Your Guide to Better Office Visits"   Lakeva Hollon A. Jimmye Norman, RD, LDN

## 2014-01-02 DIAGNOSIS — E109 Type 1 diabetes mellitus without complications: Secondary | ICD-10-CM | POA: Diagnosis not present

## 2014-01-02 DIAGNOSIS — E785 Hyperlipidemia, unspecified: Secondary | ICD-10-CM | POA: Diagnosis not present

## 2014-01-02 DIAGNOSIS — I259 Chronic ischemic heart disease, unspecified: Secondary | ICD-10-CM | POA: Diagnosis not present

## 2014-01-02 DIAGNOSIS — I10 Essential (primary) hypertension: Secondary | ICD-10-CM | POA: Diagnosis not present

## 2014-04-10 DIAGNOSIS — E039 Hypothyroidism, unspecified: Secondary | ICD-10-CM | POA: Diagnosis not present

## 2014-04-10 DIAGNOSIS — F329 Major depressive disorder, single episode, unspecified: Secondary | ICD-10-CM | POA: Diagnosis not present

## 2014-04-10 DIAGNOSIS — I119 Hypertensive heart disease without heart failure: Secondary | ICD-10-CM | POA: Diagnosis not present

## 2014-04-10 DIAGNOSIS — G4700 Insomnia, unspecified: Secondary | ICD-10-CM | POA: Diagnosis not present

## 2014-04-10 DIAGNOSIS — F3289 Other specified depressive episodes: Secondary | ICD-10-CM | POA: Diagnosis not present

## 2014-04-10 DIAGNOSIS — IMO0001 Reserved for inherently not codable concepts without codable children: Secondary | ICD-10-CM | POA: Diagnosis not present

## 2014-04-10 DIAGNOSIS — F411 Generalized anxiety disorder: Secondary | ICD-10-CM | POA: Diagnosis not present

## 2014-05-31 DIAGNOSIS — Z23 Encounter for immunization: Secondary | ICD-10-CM | POA: Diagnosis not present

## 2014-07-05 ENCOUNTER — Encounter (HOSPITAL_COMMUNITY): Payer: Self-pay | Admitting: Cardiovascular Disease

## 2014-07-09 DIAGNOSIS — I10 Essential (primary) hypertension: Secondary | ICD-10-CM | POA: Diagnosis not present

## 2014-07-09 DIAGNOSIS — M797 Fibromyalgia: Secondary | ICD-10-CM | POA: Diagnosis not present

## 2014-07-09 DIAGNOSIS — E039 Hypothyroidism, unspecified: Secondary | ICD-10-CM | POA: Diagnosis not present

## 2014-07-09 DIAGNOSIS — I119 Hypertensive heart disease without heart failure: Secondary | ICD-10-CM | POA: Diagnosis not present

## 2014-07-10 DIAGNOSIS — M62830 Muscle spasm of back: Secondary | ICD-10-CM | POA: Diagnosis not present

## 2014-07-10 DIAGNOSIS — I10 Essential (primary) hypertension: Secondary | ICD-10-CM | POA: Diagnosis not present

## 2014-07-10 DIAGNOSIS — M797 Fibromyalgia: Secondary | ICD-10-CM | POA: Diagnosis not present

## 2014-07-10 DIAGNOSIS — I119 Hypertensive heart disease without heart failure: Secondary | ICD-10-CM | POA: Diagnosis not present

## 2014-07-10 DIAGNOSIS — E039 Hypothyroidism, unspecified: Secondary | ICD-10-CM | POA: Diagnosis not present

## 2014-07-10 LAB — HEPATIC FUNCTION PANEL
ALT: 21 (ref 7–35)
AST: 19 (ref 13–35)
Alkaline Phosphatase: 101 (ref 25–125)
Bilirubin, Direct: 0.2 (ref 0.01–0.4)
Bilirubin, Total: 0.8

## 2014-07-10 LAB — LIPID PANEL
Cholesterol: 155 (ref 0–200)
HDL: 43 (ref 35–70)
LDL Cholesterol: 79
Triglycerides: 165 — AB (ref 40–160)

## 2014-07-10 LAB — MICROALBUMIN, URINE: Microalb, Ur: 0.2

## 2014-10-10 DIAGNOSIS — E039 Hypothyroidism, unspecified: Secondary | ICD-10-CM | POA: Diagnosis not present

## 2014-10-10 DIAGNOSIS — I10 Essential (primary) hypertension: Secondary | ICD-10-CM | POA: Diagnosis not present

## 2014-10-10 DIAGNOSIS — E1165 Type 2 diabetes mellitus with hyperglycemia: Secondary | ICD-10-CM | POA: Diagnosis not present

## 2014-10-10 DIAGNOSIS — I119 Hypertensive heart disease without heart failure: Secondary | ICD-10-CM | POA: Diagnosis not present

## 2014-10-10 DIAGNOSIS — F419 Anxiety disorder, unspecified: Secondary | ICD-10-CM | POA: Diagnosis not present

## 2015-01-16 DIAGNOSIS — I119 Hypertensive heart disease without heart failure: Secondary | ICD-10-CM | POA: Diagnosis not present

## 2015-01-16 DIAGNOSIS — I10 Essential (primary) hypertension: Secondary | ICD-10-CM | POA: Diagnosis not present

## 2015-01-16 DIAGNOSIS — F419 Anxiety disorder, unspecified: Secondary | ICD-10-CM | POA: Diagnosis not present

## 2015-01-16 DIAGNOSIS — E039 Hypothyroidism, unspecified: Secondary | ICD-10-CM | POA: Diagnosis not present

## 2015-01-21 DIAGNOSIS — I119 Hypertensive heart disease without heart failure: Secondary | ICD-10-CM | POA: Diagnosis not present

## 2015-01-21 DIAGNOSIS — I10 Essential (primary) hypertension: Secondary | ICD-10-CM | POA: Diagnosis not present

## 2015-01-21 DIAGNOSIS — E1165 Type 2 diabetes mellitus with hyperglycemia: Secondary | ICD-10-CM | POA: Diagnosis not present

## 2015-01-21 DIAGNOSIS — M542 Cervicalgia: Secondary | ICD-10-CM | POA: Diagnosis not present

## 2015-04-02 DIAGNOSIS — M545 Low back pain: Secondary | ICD-10-CM | POA: Diagnosis not present

## 2015-04-02 DIAGNOSIS — M9901 Segmental and somatic dysfunction of cervical region: Secondary | ICD-10-CM | POA: Diagnosis not present

## 2015-04-02 DIAGNOSIS — M542 Cervicalgia: Secondary | ICD-10-CM | POA: Diagnosis not present

## 2015-04-02 DIAGNOSIS — M9903 Segmental and somatic dysfunction of lumbar region: Secondary | ICD-10-CM | POA: Diagnosis not present

## 2015-04-05 DIAGNOSIS — M9903 Segmental and somatic dysfunction of lumbar region: Secondary | ICD-10-CM | POA: Diagnosis not present

## 2015-04-05 DIAGNOSIS — M9901 Segmental and somatic dysfunction of cervical region: Secondary | ICD-10-CM | POA: Diagnosis not present

## 2015-04-05 DIAGNOSIS — M542 Cervicalgia: Secondary | ICD-10-CM | POA: Diagnosis not present

## 2015-04-05 DIAGNOSIS — M545 Low back pain: Secondary | ICD-10-CM | POA: Diagnosis not present

## 2015-04-08 DIAGNOSIS — M9901 Segmental and somatic dysfunction of cervical region: Secondary | ICD-10-CM | POA: Diagnosis not present

## 2015-04-08 DIAGNOSIS — M545 Low back pain: Secondary | ICD-10-CM | POA: Diagnosis not present

## 2015-04-08 DIAGNOSIS — M9903 Segmental and somatic dysfunction of lumbar region: Secondary | ICD-10-CM | POA: Diagnosis not present

## 2015-04-08 DIAGNOSIS — M542 Cervicalgia: Secondary | ICD-10-CM | POA: Diagnosis not present

## 2015-04-10 DIAGNOSIS — M545 Low back pain: Secondary | ICD-10-CM | POA: Diagnosis not present

## 2015-04-10 DIAGNOSIS — M542 Cervicalgia: Secondary | ICD-10-CM | POA: Diagnosis not present

## 2015-04-10 DIAGNOSIS — M9903 Segmental and somatic dysfunction of lumbar region: Secondary | ICD-10-CM | POA: Diagnosis not present

## 2015-04-10 DIAGNOSIS — M9901 Segmental and somatic dysfunction of cervical region: Secondary | ICD-10-CM | POA: Diagnosis not present

## 2015-04-12 DIAGNOSIS — M545 Low back pain: Secondary | ICD-10-CM | POA: Diagnosis not present

## 2015-04-12 DIAGNOSIS — M9901 Segmental and somatic dysfunction of cervical region: Secondary | ICD-10-CM | POA: Diagnosis not present

## 2015-04-12 DIAGNOSIS — M542 Cervicalgia: Secondary | ICD-10-CM | POA: Diagnosis not present

## 2015-04-12 DIAGNOSIS — M9903 Segmental and somatic dysfunction of lumbar region: Secondary | ICD-10-CM | POA: Diagnosis not present

## 2015-04-16 DIAGNOSIS — M9901 Segmental and somatic dysfunction of cervical region: Secondary | ICD-10-CM | POA: Diagnosis not present

## 2015-04-16 DIAGNOSIS — M545 Low back pain: Secondary | ICD-10-CM | POA: Diagnosis not present

## 2015-04-16 DIAGNOSIS — M9903 Segmental and somatic dysfunction of lumbar region: Secondary | ICD-10-CM | POA: Diagnosis not present

## 2015-04-16 DIAGNOSIS — M542 Cervicalgia: Secondary | ICD-10-CM | POA: Diagnosis not present

## 2015-04-18 DIAGNOSIS — M9903 Segmental and somatic dysfunction of lumbar region: Secondary | ICD-10-CM | POA: Diagnosis not present

## 2015-04-18 DIAGNOSIS — M9901 Segmental and somatic dysfunction of cervical region: Secondary | ICD-10-CM | POA: Diagnosis not present

## 2015-04-18 DIAGNOSIS — M542 Cervicalgia: Secondary | ICD-10-CM | POA: Diagnosis not present

## 2015-04-18 DIAGNOSIS — M545 Low back pain: Secondary | ICD-10-CM | POA: Diagnosis not present

## 2015-04-19 DIAGNOSIS — M542 Cervicalgia: Secondary | ICD-10-CM | POA: Diagnosis not present

## 2015-04-19 DIAGNOSIS — M545 Low back pain: Secondary | ICD-10-CM | POA: Diagnosis not present

## 2015-04-19 DIAGNOSIS — M9903 Segmental and somatic dysfunction of lumbar region: Secondary | ICD-10-CM | POA: Diagnosis not present

## 2015-04-19 DIAGNOSIS — M9901 Segmental and somatic dysfunction of cervical region: Secondary | ICD-10-CM | POA: Diagnosis not present

## 2015-04-22 DIAGNOSIS — M9901 Segmental and somatic dysfunction of cervical region: Secondary | ICD-10-CM | POA: Diagnosis not present

## 2015-04-22 DIAGNOSIS — M9903 Segmental and somatic dysfunction of lumbar region: Secondary | ICD-10-CM | POA: Diagnosis not present

## 2015-04-22 DIAGNOSIS — M542 Cervicalgia: Secondary | ICD-10-CM | POA: Diagnosis not present

## 2015-04-22 DIAGNOSIS — M545 Low back pain: Secondary | ICD-10-CM | POA: Diagnosis not present

## 2015-04-24 DIAGNOSIS — M545 Low back pain: Secondary | ICD-10-CM | POA: Diagnosis not present

## 2015-04-24 DIAGNOSIS — M542 Cervicalgia: Secondary | ICD-10-CM | POA: Diagnosis not present

## 2015-04-24 DIAGNOSIS — M9903 Segmental and somatic dysfunction of lumbar region: Secondary | ICD-10-CM | POA: Diagnosis not present

## 2015-04-24 DIAGNOSIS — M9901 Segmental and somatic dysfunction of cervical region: Secondary | ICD-10-CM | POA: Diagnosis not present

## 2015-04-25 ENCOUNTER — Other Ambulatory Visit (HOSPITAL_COMMUNITY): Payer: Self-pay | Admitting: Pulmonary Disease

## 2015-04-25 ENCOUNTER — Ambulatory Visit (HOSPITAL_COMMUNITY)
Admission: RE | Admit: 2015-04-25 | Discharge: 2015-04-25 | Disposition: A | Discharge status: Home / Self Care | Payer: Medicare Other | Source: Ambulatory Visit | Attending: Pulmonary Disease | Admitting: Pulmonary Disease

## 2015-04-25 DIAGNOSIS — M542 Cervicalgia: Secondary | ICD-10-CM

## 2015-04-25 DIAGNOSIS — M47892 Other spondylosis, cervical region: Secondary | ICD-10-CM | POA: Diagnosis not present

## 2015-04-25 DIAGNOSIS — R51 Headache: Secondary | ICD-10-CM | POA: Insufficient documentation

## 2015-04-25 DIAGNOSIS — I10 Essential (primary) hypertension: Secondary | ICD-10-CM | POA: Diagnosis not present

## 2015-04-25 DIAGNOSIS — E1165 Type 2 diabetes mellitus with hyperglycemia: Secondary | ICD-10-CM | POA: Diagnosis not present

## 2015-04-25 DIAGNOSIS — M47812 Spondylosis without myelopathy or radiculopathy, cervical region: Secondary | ICD-10-CM | POA: Diagnosis not present

## 2015-04-25 DIAGNOSIS — Z23 Encounter for immunization: Secondary | ICD-10-CM | POA: Diagnosis not present

## 2015-04-25 DIAGNOSIS — I119 Hypertensive heart disease without heart failure: Secondary | ICD-10-CM | POA: Diagnosis not present

## 2015-04-29 ENCOUNTER — Other Ambulatory Visit: Payer: Self-pay | Admitting: Pulmonary Disease

## 2015-04-29 DIAGNOSIS — M542 Cervicalgia: Secondary | ICD-10-CM

## 2015-04-29 DIAGNOSIS — M9901 Segmental and somatic dysfunction of cervical region: Secondary | ICD-10-CM | POA: Diagnosis not present

## 2015-04-29 DIAGNOSIS — M545 Low back pain: Secondary | ICD-10-CM | POA: Diagnosis not present

## 2015-04-29 DIAGNOSIS — M9903 Segmental and somatic dysfunction of lumbar region: Secondary | ICD-10-CM | POA: Diagnosis not present

## 2015-05-01 DIAGNOSIS — M9901 Segmental and somatic dysfunction of cervical region: Secondary | ICD-10-CM | POA: Diagnosis not present

## 2015-05-01 DIAGNOSIS — M9903 Segmental and somatic dysfunction of lumbar region: Secondary | ICD-10-CM | POA: Diagnosis not present

## 2015-05-01 DIAGNOSIS — M542 Cervicalgia: Secondary | ICD-10-CM | POA: Diagnosis not present

## 2015-05-01 DIAGNOSIS — M545 Low back pain: Secondary | ICD-10-CM | POA: Diagnosis not present

## 2015-05-02 ENCOUNTER — Other Ambulatory Visit: Payer: Self-pay | Admitting: Pulmonary Disease

## 2015-05-02 ENCOUNTER — Ambulatory Visit
Admission: RE | Admit: 2015-05-02 | Discharge: 2015-05-02 | Disposition: A | Discharge status: Home / Self Care | Payer: Medicare Other | Source: Ambulatory Visit | Attending: Pulmonary Disease | Admitting: Pulmonary Disease

## 2015-05-02 DIAGNOSIS — T1590XA Foreign body on external eye, part unspecified, unspecified eye, initial encounter: Secondary | ICD-10-CM

## 2015-05-02 DIAGNOSIS — M542 Cervicalgia: Secondary | ICD-10-CM

## 2015-05-03 ENCOUNTER — Ambulatory Visit
Admission: RE | Admit: 2015-05-03 | Discharge: 2015-05-03 | Disposition: A | Discharge status: Home / Self Care | Payer: Medicare Other | Source: Ambulatory Visit | Attending: Pulmonary Disease | Admitting: Pulmonary Disease

## 2015-05-03 DIAGNOSIS — T1590XA Foreign body on external eye, part unspecified, unspecified eye, initial encounter: Secondary | ICD-10-CM

## 2015-05-03 DIAGNOSIS — Z0189 Encounter for other specified special examinations: Secondary | ICD-10-CM | POA: Diagnosis not present

## 2015-05-10 DIAGNOSIS — M9903 Segmental and somatic dysfunction of lumbar region: Secondary | ICD-10-CM | POA: Diagnosis not present

## 2015-05-10 DIAGNOSIS — M545 Low back pain: Secondary | ICD-10-CM | POA: Diagnosis not present

## 2015-05-10 DIAGNOSIS — M9901 Segmental and somatic dysfunction of cervical region: Secondary | ICD-10-CM | POA: Diagnosis not present

## 2015-05-10 DIAGNOSIS — M542 Cervicalgia: Secondary | ICD-10-CM | POA: Diagnosis not present

## 2015-05-10 DIAGNOSIS — Z23 Encounter for immunization: Secondary | ICD-10-CM | POA: Diagnosis not present

## 2015-05-16 ENCOUNTER — Ambulatory Visit
Admission: RE | Admit: 2015-05-16 | Discharge: 2015-05-16 | Disposition: A | Discharge status: Home / Self Care | Payer: Medicare Other | Source: Ambulatory Visit | Attending: Pulmonary Disease | Admitting: Pulmonary Disease

## 2015-05-16 DIAGNOSIS — M5030 Other cervical disc degeneration, unspecified cervical region: Secondary | ICD-10-CM | POA: Diagnosis not present

## 2015-05-17 DIAGNOSIS — M9903 Segmental and somatic dysfunction of lumbar region: Secondary | ICD-10-CM | POA: Diagnosis not present

## 2015-05-17 DIAGNOSIS — M9901 Segmental and somatic dysfunction of cervical region: Secondary | ICD-10-CM | POA: Diagnosis not present

## 2015-05-17 DIAGNOSIS — M545 Low back pain: Secondary | ICD-10-CM | POA: Diagnosis not present

## 2015-05-17 DIAGNOSIS — M542 Cervicalgia: Secondary | ICD-10-CM | POA: Diagnosis not present

## 2015-05-20 DIAGNOSIS — C3490 Malignant neoplasm of unspecified part of unspecified bronchus or lung: Secondary | ICD-10-CM | POA: Diagnosis not present

## 2015-05-20 DIAGNOSIS — I629 Nontraumatic intracranial hemorrhage, unspecified: Secondary | ICD-10-CM | POA: Diagnosis not present

## 2015-05-20 DIAGNOSIS — J449 Chronic obstructive pulmonary disease, unspecified: Secondary | ICD-10-CM | POA: Diagnosis not present

## 2015-05-20 DIAGNOSIS — K210 Gastro-esophageal reflux disease with esophagitis: Secondary | ICD-10-CM | POA: Diagnosis not present

## 2015-05-24 DIAGNOSIS — M545 Low back pain: Secondary | ICD-10-CM | POA: Diagnosis not present

## 2015-05-24 DIAGNOSIS — M9901 Segmental and somatic dysfunction of cervical region: Secondary | ICD-10-CM | POA: Diagnosis not present

## 2015-05-24 DIAGNOSIS — M542 Cervicalgia: Secondary | ICD-10-CM | POA: Diagnosis not present

## 2015-05-24 DIAGNOSIS — M9903 Segmental and somatic dysfunction of lumbar region: Secondary | ICD-10-CM | POA: Diagnosis not present

## 2015-06-05 DIAGNOSIS — M47813 Spondylosis without myelopathy or radiculopathy, cervicothoracic region: Secondary | ICD-10-CM | POA: Diagnosis not present

## 2015-06-05 DIAGNOSIS — M47812 Spondylosis without myelopathy or radiculopathy, cervical region: Secondary | ICD-10-CM | POA: Diagnosis not present

## 2015-06-05 DIAGNOSIS — M47817 Spondylosis without myelopathy or radiculopathy, lumbosacral region: Secondary | ICD-10-CM | POA: Diagnosis not present

## 2015-06-11 DIAGNOSIS — H2513 Age-related nuclear cataract, bilateral: Secondary | ICD-10-CM | POA: Diagnosis not present

## 2015-06-11 DIAGNOSIS — E119 Type 2 diabetes mellitus without complications: Secondary | ICD-10-CM | POA: Diagnosis not present

## 2015-06-11 DIAGNOSIS — H25013 Cortical age-related cataract, bilateral: Secondary | ICD-10-CM | POA: Diagnosis not present

## 2015-06-25 DIAGNOSIS — Z1231 Encounter for screening mammogram for malignant neoplasm of breast: Secondary | ICD-10-CM | POA: Diagnosis not present

## 2015-06-26 DIAGNOSIS — G894 Chronic pain syndrome: Secondary | ICD-10-CM | POA: Diagnosis not present

## 2015-06-26 DIAGNOSIS — M542 Cervicalgia: Secondary | ICD-10-CM | POA: Diagnosis not present

## 2015-06-26 DIAGNOSIS — M5481 Occipital neuralgia: Secondary | ICD-10-CM | POA: Diagnosis not present

## 2015-06-26 DIAGNOSIS — M47812 Spondylosis without myelopathy or radiculopathy, cervical region: Secondary | ICD-10-CM | POA: Diagnosis not present

## 2015-06-26 DIAGNOSIS — M792 Neuralgia and neuritis, unspecified: Secondary | ICD-10-CM | POA: Diagnosis not present

## 2015-07-03 DIAGNOSIS — N6002 Solitary cyst of left breast: Secondary | ICD-10-CM | POA: Diagnosis not present

## 2015-07-03 DIAGNOSIS — Z1231 Encounter for screening mammogram for malignant neoplasm of breast: Secondary | ICD-10-CM | POA: Diagnosis not present

## 2015-07-10 DIAGNOSIS — G894 Chronic pain syndrome: Secondary | ICD-10-CM | POA: Diagnosis not present

## 2015-07-10 DIAGNOSIS — M47812 Spondylosis without myelopathy or radiculopathy, cervical region: Secondary | ICD-10-CM | POA: Diagnosis not present

## 2015-07-10 DIAGNOSIS — M542 Cervicalgia: Secondary | ICD-10-CM | POA: Diagnosis not present

## 2015-07-17 DIAGNOSIS — M47812 Spondylosis without myelopathy or radiculopathy, cervical region: Secondary | ICD-10-CM | POA: Diagnosis not present

## 2015-07-17 DIAGNOSIS — G894 Chronic pain syndrome: Secondary | ICD-10-CM | POA: Diagnosis not present

## 2015-07-17 DIAGNOSIS — M542 Cervicalgia: Secondary | ICD-10-CM | POA: Diagnosis not present

## 2015-07-25 DIAGNOSIS — E119 Type 2 diabetes mellitus without complications: Secondary | ICD-10-CM | POA: Diagnosis not present

## 2015-07-25 DIAGNOSIS — M542 Cervicalgia: Secondary | ICD-10-CM | POA: Diagnosis not present

## 2015-07-25 DIAGNOSIS — I119 Hypertensive heart disease without heart failure: Secondary | ICD-10-CM | POA: Diagnosis not present

## 2015-07-25 DIAGNOSIS — F419 Anxiety disorder, unspecified: Secondary | ICD-10-CM | POA: Diagnosis not present

## 2015-08-19 DIAGNOSIS — M5033 Other cervical disc degeneration, cervicothoracic region: Secondary | ICD-10-CM | POA: Diagnosis not present

## 2015-08-19 DIAGNOSIS — M542 Cervicalgia: Secondary | ICD-10-CM | POA: Diagnosis not present

## 2015-08-19 DIAGNOSIS — M47812 Spondylosis without myelopathy or radiculopathy, cervical region: Secondary | ICD-10-CM | POA: Diagnosis not present

## 2015-08-19 DIAGNOSIS — M4803 Spinal stenosis, cervicothoracic region: Secondary | ICD-10-CM | POA: Diagnosis not present

## 2015-08-19 DIAGNOSIS — G894 Chronic pain syndrome: Secondary | ICD-10-CM | POA: Diagnosis not present

## 2015-08-26 DIAGNOSIS — G894 Chronic pain syndrome: Secondary | ICD-10-CM | POA: Diagnosis not present

## 2015-08-26 DIAGNOSIS — M5033 Other cervical disc degeneration, cervicothoracic region: Secondary | ICD-10-CM | POA: Diagnosis not present

## 2015-08-26 DIAGNOSIS — M4803 Spinal stenosis, cervicothoracic region: Secondary | ICD-10-CM | POA: Diagnosis not present

## 2015-08-26 DIAGNOSIS — M542 Cervicalgia: Secondary | ICD-10-CM | POA: Diagnosis not present

## 2015-08-29 LAB — BASIC METABOLIC PANEL: Creatinine: 1 (ref <=1.1)

## 2015-09-03 DIAGNOSIS — M542 Cervicalgia: Secondary | ICD-10-CM | POA: Diagnosis not present

## 2015-09-03 DIAGNOSIS — F419 Anxiety disorder, unspecified: Secondary | ICD-10-CM | POA: Diagnosis not present

## 2015-09-03 DIAGNOSIS — I119 Hypertensive heart disease without heart failure: Secondary | ICD-10-CM | POA: Diagnosis not present

## 2015-09-03 DIAGNOSIS — E119 Type 2 diabetes mellitus without complications: Secondary | ICD-10-CM | POA: Diagnosis not present

## 2015-09-03 LAB — BASIC METABOLIC PANEL
BUN: 18 (ref 4–21)
CO2: 25 — AB (ref 13–22)
Chloride: 103 (ref 99–108)
Glucose: 175
Potassium: 4.3 (ref 3.4–5.3)
Sodium: 140 (ref 137–147)

## 2015-09-03 LAB — VITAMIN B12: Vitamin B-12: 517

## 2015-09-09 DIAGNOSIS — M542 Cervicalgia: Secondary | ICD-10-CM | POA: Diagnosis not present

## 2015-09-09 DIAGNOSIS — M47812 Spondylosis without myelopathy or radiculopathy, cervical region: Secondary | ICD-10-CM | POA: Diagnosis not present

## 2015-09-09 DIAGNOSIS — M792 Neuralgia and neuritis, unspecified: Secondary | ICD-10-CM | POA: Diagnosis not present

## 2015-09-09 DIAGNOSIS — M4803 Spinal stenosis, cervicothoracic region: Secondary | ICD-10-CM | POA: Diagnosis not present

## 2015-09-09 DIAGNOSIS — G894 Chronic pain syndrome: Secondary | ICD-10-CM | POA: Diagnosis not present

## 2015-09-09 DIAGNOSIS — M5033 Other cervical disc degeneration, cervicothoracic region: Secondary | ICD-10-CM | POA: Diagnosis not present

## 2015-12-09 DIAGNOSIS — H04123 Dry eye syndrome of bilateral lacrimal glands: Secondary | ICD-10-CM | POA: Diagnosis not present

## 2015-12-09 DIAGNOSIS — H2513 Age-related nuclear cataract, bilateral: Secondary | ICD-10-CM | POA: Diagnosis not present

## 2015-12-09 DIAGNOSIS — E119 Type 2 diabetes mellitus without complications: Secondary | ICD-10-CM | POA: Diagnosis not present

## 2015-12-09 DIAGNOSIS — H40003 Preglaucoma, unspecified, bilateral: Secondary | ICD-10-CM | POA: Diagnosis not present

## 2015-12-18 DIAGNOSIS — E1165 Type 2 diabetes mellitus with hyperglycemia: Secondary | ICD-10-CM | POA: Diagnosis not present

## 2015-12-18 DIAGNOSIS — I10 Essential (primary) hypertension: Secondary | ICD-10-CM | POA: Diagnosis not present

## 2015-12-18 DIAGNOSIS — I119 Hypertensive heart disease without heart failure: Secondary | ICD-10-CM | POA: Diagnosis not present

## 2015-12-18 DIAGNOSIS — J309 Allergic rhinitis, unspecified: Secondary | ICD-10-CM | POA: Diagnosis not present

## 2016-02-18 DIAGNOSIS — E1165 Type 2 diabetes mellitus with hyperglycemia: Secondary | ICD-10-CM | POA: Diagnosis not present

## 2016-02-18 DIAGNOSIS — G4700 Insomnia, unspecified: Secondary | ICD-10-CM | POA: Diagnosis not present

## 2016-02-18 DIAGNOSIS — I10 Essential (primary) hypertension: Secondary | ICD-10-CM | POA: Diagnosis not present

## 2016-02-18 DIAGNOSIS — I119 Hypertensive heart disease without heart failure: Secondary | ICD-10-CM | POA: Diagnosis not present

## 2016-03-23 DIAGNOSIS — H04123 Dry eye syndrome of bilateral lacrimal glands: Secondary | ICD-10-CM | POA: Diagnosis not present

## 2016-03-23 DIAGNOSIS — H2513 Age-related nuclear cataract, bilateral: Secondary | ICD-10-CM | POA: Diagnosis not present

## 2016-03-31 ENCOUNTER — Other Ambulatory Visit: Payer: Self-pay

## 2016-05-26 ENCOUNTER — Other Ambulatory Visit (HOSPITAL_COMMUNITY): Payer: Self-pay | Admitting: Pulmonary Disease

## 2016-05-26 DIAGNOSIS — Z23 Encounter for immunization: Secondary | ICD-10-CM | POA: Diagnosis not present

## 2016-05-26 DIAGNOSIS — M545 Low back pain: Secondary | ICD-10-CM

## 2016-05-26 DIAGNOSIS — E1165 Type 2 diabetes mellitus with hyperglycemia: Secondary | ICD-10-CM | POA: Diagnosis not present

## 2016-05-26 DIAGNOSIS — I119 Hypertensive heart disease without heart failure: Secondary | ICD-10-CM | POA: Diagnosis not present

## 2016-05-26 DIAGNOSIS — I10 Essential (primary) hypertension: Secondary | ICD-10-CM | POA: Diagnosis not present

## 2016-06-03 ENCOUNTER — Ambulatory Visit (HOSPITAL_COMMUNITY): Payer: Medicare Other

## 2016-06-06 ENCOUNTER — Ambulatory Visit
Admission: RE | Admit: 2016-06-06 | Discharge: 2016-06-06 | Disposition: A | Discharge status: Home / Self Care | Payer: Medicare Other | Source: Ambulatory Visit | Attending: Pulmonary Disease | Admitting: Pulmonary Disease

## 2016-06-06 DIAGNOSIS — M48061 Spinal stenosis, lumbar region without neurogenic claudication: Secondary | ICD-10-CM | POA: Diagnosis not present

## 2016-06-06 DIAGNOSIS — M545 Low back pain: Secondary | ICD-10-CM

## 2016-06-30 DIAGNOSIS — E119 Type 2 diabetes mellitus without complications: Secondary | ICD-10-CM | POA: Diagnosis not present

## 2016-06-30 DIAGNOSIS — H25013 Cortical age-related cataract, bilateral: Secondary | ICD-10-CM | POA: Diagnosis not present

## 2016-06-30 DIAGNOSIS — H2513 Age-related nuclear cataract, bilateral: Secondary | ICD-10-CM | POA: Diagnosis not present

## 2016-07-06 DIAGNOSIS — Z803 Family history of malignant neoplasm of breast: Secondary | ICD-10-CM | POA: Diagnosis not present

## 2016-07-06 DIAGNOSIS — Z1231 Encounter for screening mammogram for malignant neoplasm of breast: Secondary | ICD-10-CM | POA: Diagnosis not present

## 2016-07-23 DIAGNOSIS — M545 Low back pain: Secondary | ICD-10-CM | POA: Diagnosis not present

## 2016-07-23 DIAGNOSIS — I119 Hypertensive heart disease without heart failure: Secondary | ICD-10-CM | POA: Diagnosis not present

## 2016-07-23 DIAGNOSIS — I10 Essential (primary) hypertension: Secondary | ICD-10-CM | POA: Diagnosis not present

## 2016-07-23 DIAGNOSIS — E1165 Type 2 diabetes mellitus with hyperglycemia: Secondary | ICD-10-CM | POA: Diagnosis not present

## 2016-07-23 LAB — HEMOGLOBIN A1C: Hemoglobin A1C: 6.3

## 2016-07-29 DIAGNOSIS — I119 Hypertensive heart disease without heart failure: Secondary | ICD-10-CM | POA: Diagnosis not present

## 2016-07-29 DIAGNOSIS — I10 Essential (primary) hypertension: Secondary | ICD-10-CM | POA: Diagnosis not present

## 2016-07-29 DIAGNOSIS — E119 Type 2 diabetes mellitus without complications: Secondary | ICD-10-CM | POA: Diagnosis not present

## 2016-07-29 DIAGNOSIS — M549 Dorsalgia, unspecified: Secondary | ICD-10-CM | POA: Diagnosis not present

## 2016-09-07 DIAGNOSIS — M5416 Radiculopathy, lumbar region: Secondary | ICD-10-CM | POA: Diagnosis not present

## 2016-09-07 DIAGNOSIS — M5136 Other intervertebral disc degeneration, lumbar region: Secondary | ICD-10-CM | POA: Diagnosis not present

## 2016-09-07 DIAGNOSIS — I10 Essential (primary) hypertension: Secondary | ICD-10-CM | POA: Diagnosis not present

## 2016-09-07 DIAGNOSIS — M5126 Other intervertebral disc displacement, lumbar region: Secondary | ICD-10-CM | POA: Diagnosis not present

## 2016-09-07 DIAGNOSIS — Z6827 Body mass index (BMI) 27.0-27.9, adult: Secondary | ICD-10-CM | POA: Diagnosis not present

## 2016-09-07 DIAGNOSIS — M4316 Spondylolisthesis, lumbar region: Secondary | ICD-10-CM | POA: Diagnosis not present

## 2016-10-27 DIAGNOSIS — E1165 Type 2 diabetes mellitus with hyperglycemia: Secondary | ICD-10-CM | POA: Diagnosis not present

## 2016-10-27 DIAGNOSIS — I2510 Atherosclerotic heart disease of native coronary artery without angina pectoris: Secondary | ICD-10-CM | POA: Diagnosis not present

## 2016-10-27 DIAGNOSIS — J309 Allergic rhinitis, unspecified: Secondary | ICD-10-CM | POA: Diagnosis not present

## 2016-10-27 DIAGNOSIS — I10 Essential (primary) hypertension: Secondary | ICD-10-CM | POA: Diagnosis not present

## 2016-12-08 DIAGNOSIS — I2510 Atherosclerotic heart disease of native coronary artery without angina pectoris: Secondary | ICD-10-CM | POA: Diagnosis not present

## 2016-12-08 DIAGNOSIS — I10 Essential (primary) hypertension: Secondary | ICD-10-CM | POA: Diagnosis not present

## 2016-12-08 DIAGNOSIS — F419 Anxiety disorder, unspecified: Secondary | ICD-10-CM | POA: Diagnosis not present

## 2016-12-08 DIAGNOSIS — E1165 Type 2 diabetes mellitus with hyperglycemia: Secondary | ICD-10-CM | POA: Diagnosis not present

## 2016-12-30 DIAGNOSIS — H2513 Age-related nuclear cataract, bilateral: Secondary | ICD-10-CM | POA: Diagnosis not present

## 2016-12-30 DIAGNOSIS — E119 Type 2 diabetes mellitus without complications: Secondary | ICD-10-CM | POA: Diagnosis not present

## 2016-12-30 DIAGNOSIS — H43312 Vitreous membranes and strands, left eye: Secondary | ICD-10-CM | POA: Diagnosis not present

## 2017-01-01 DIAGNOSIS — E782 Mixed hyperlipidemia: Secondary | ICD-10-CM | POA: Diagnosis not present

## 2017-01-01 DIAGNOSIS — E1165 Type 2 diabetes mellitus with hyperglycemia: Secondary | ICD-10-CM | POA: Diagnosis not present

## 2017-01-01 DIAGNOSIS — Z79899 Other long term (current) drug therapy: Secondary | ICD-10-CM | POA: Diagnosis not present

## 2017-01-07 DIAGNOSIS — I10 Essential (primary) hypertension: Secondary | ICD-10-CM | POA: Diagnosis not present

## 2017-01-07 DIAGNOSIS — I2510 Atherosclerotic heart disease of native coronary artery without angina pectoris: Secondary | ICD-10-CM | POA: Diagnosis not present

## 2017-01-07 DIAGNOSIS — Z794 Long term (current) use of insulin: Secondary | ICD-10-CM | POA: Diagnosis not present

## 2017-01-07 DIAGNOSIS — Z79899 Other long term (current) drug therapy: Secondary | ICD-10-CM | POA: Diagnosis not present

## 2017-01-07 DIAGNOSIS — E782 Mixed hyperlipidemia: Secondary | ICD-10-CM | POA: Diagnosis not present

## 2017-01-07 DIAGNOSIS — E1165 Type 2 diabetes mellitus with hyperglycemia: Secondary | ICD-10-CM | POA: Diagnosis not present

## 2017-01-11 DIAGNOSIS — E119 Type 2 diabetes mellitus without complications: Secondary | ICD-10-CM | POA: Diagnosis not present

## 2017-01-11 DIAGNOSIS — H3562 Retinal hemorrhage, left eye: Secondary | ICD-10-CM | POA: Diagnosis not present

## 2017-01-11 DIAGNOSIS — H43312 Vitreous membranes and strands, left eye: Secondary | ICD-10-CM | POA: Diagnosis not present

## 2017-01-25 DIAGNOSIS — H43312 Vitreous membranes and strands, left eye: Secondary | ICD-10-CM | POA: Diagnosis not present

## 2017-01-25 DIAGNOSIS — E119 Type 2 diabetes mellitus without complications: Secondary | ICD-10-CM | POA: Diagnosis not present

## 2017-01-25 DIAGNOSIS — E039 Hypothyroidism, unspecified: Secondary | ICD-10-CM | POA: Diagnosis not present

## 2017-01-25 DIAGNOSIS — E1165 Type 2 diabetes mellitus with hyperglycemia: Secondary | ICD-10-CM | POA: Diagnosis not present

## 2017-01-25 DIAGNOSIS — I2510 Atherosclerotic heart disease of native coronary artery without angina pectoris: Secondary | ICD-10-CM | POA: Diagnosis not present

## 2017-01-25 DIAGNOSIS — I10 Essential (primary) hypertension: Secondary | ICD-10-CM | POA: Diagnosis not present

## 2017-01-25 DIAGNOSIS — H3562 Retinal hemorrhage, left eye: Secondary | ICD-10-CM | POA: Diagnosis not present

## 2017-01-26 DIAGNOSIS — I10 Essential (primary) hypertension: Secondary | ICD-10-CM | POA: Diagnosis not present

## 2017-01-26 DIAGNOSIS — E1165 Type 2 diabetes mellitus with hyperglycemia: Secondary | ICD-10-CM | POA: Diagnosis not present

## 2017-01-26 DIAGNOSIS — E785 Hyperlipidemia, unspecified: Secondary | ICD-10-CM | POA: Diagnosis not present

## 2017-01-26 DIAGNOSIS — E039 Hypothyroidism, unspecified: Secondary | ICD-10-CM | POA: Diagnosis not present

## 2017-01-26 DIAGNOSIS — I2510 Atherosclerotic heart disease of native coronary artery without angina pectoris: Secondary | ICD-10-CM | POA: Diagnosis not present

## 2017-01-26 LAB — CBC: RBC: 4.66 (ref 3.87–5.11)

## 2017-01-26 LAB — CBC AND DIFFERENTIAL
HCT: 42 (ref 36–46)
Hemoglobin: 14.1 (ref 12.0–16.0)
Neutrophils Absolute: 4047
Platelets: 199 (ref 150–399)
WBC: 7.1

## 2017-02-09 DIAGNOSIS — Z79899 Other long term (current) drug therapy: Secondary | ICD-10-CM | POA: Diagnosis not present

## 2017-02-09 DIAGNOSIS — E782 Mixed hyperlipidemia: Secondary | ICD-10-CM | POA: Diagnosis not present

## 2017-02-09 DIAGNOSIS — Z955 Presence of coronary angioplasty implant and graft: Secondary | ICD-10-CM | POA: Diagnosis not present

## 2017-02-09 DIAGNOSIS — I10 Essential (primary) hypertension: Secondary | ICD-10-CM | POA: Diagnosis not present

## 2017-02-09 DIAGNOSIS — I2510 Atherosclerotic heart disease of native coronary artery without angina pectoris: Secondary | ICD-10-CM | POA: Diagnosis not present

## 2017-02-09 DIAGNOSIS — Z794 Long term (current) use of insulin: Secondary | ICD-10-CM | POA: Diagnosis not present

## 2017-02-09 DIAGNOSIS — E049 Nontoxic goiter, unspecified: Secondary | ICD-10-CM | POA: Diagnosis not present

## 2017-02-09 DIAGNOSIS — I252 Old myocardial infarction: Secondary | ICD-10-CM | POA: Diagnosis not present

## 2017-02-09 DIAGNOSIS — E1165 Type 2 diabetes mellitus with hyperglycemia: Secondary | ICD-10-CM | POA: Diagnosis not present

## 2017-03-17 DIAGNOSIS — E1165 Type 2 diabetes mellitus with hyperglycemia: Secondary | ICD-10-CM | POA: Diagnosis not present

## 2017-03-17 DIAGNOSIS — E782 Mixed hyperlipidemia: Secondary | ICD-10-CM | POA: Diagnosis not present

## 2017-03-17 DIAGNOSIS — Z794 Long term (current) use of insulin: Secondary | ICD-10-CM | POA: Diagnosis not present

## 2017-03-25 DIAGNOSIS — Z79899 Other long term (current) drug therapy: Secondary | ICD-10-CM | POA: Diagnosis not present

## 2017-03-25 DIAGNOSIS — Z794 Long term (current) use of insulin: Secondary | ICD-10-CM | POA: Diagnosis not present

## 2017-03-25 DIAGNOSIS — I2510 Atherosclerotic heart disease of native coronary artery without angina pectoris: Secondary | ICD-10-CM | POA: Diagnosis not present

## 2017-03-25 DIAGNOSIS — I10 Essential (primary) hypertension: Secondary | ICD-10-CM | POA: Diagnosis not present

## 2017-03-25 DIAGNOSIS — E782 Mixed hyperlipidemia: Secondary | ICD-10-CM | POA: Diagnosis not present

## 2017-03-25 DIAGNOSIS — E1165 Type 2 diabetes mellitus with hyperglycemia: Secondary | ICD-10-CM | POA: Diagnosis not present

## 2017-04-16 IMAGING — DX DG CERVICAL SPINE COMPLETE 4+V
4 series · 4 of 4 positions shown · non-contrast
Comparison: None.

CLINICAL DATA: Headaches and right-sided neck pain for 3 months, no
known injury, initial encounter

EXAM:
CERVICAL SPINE  4+ VIEWS

[c-spine lat]
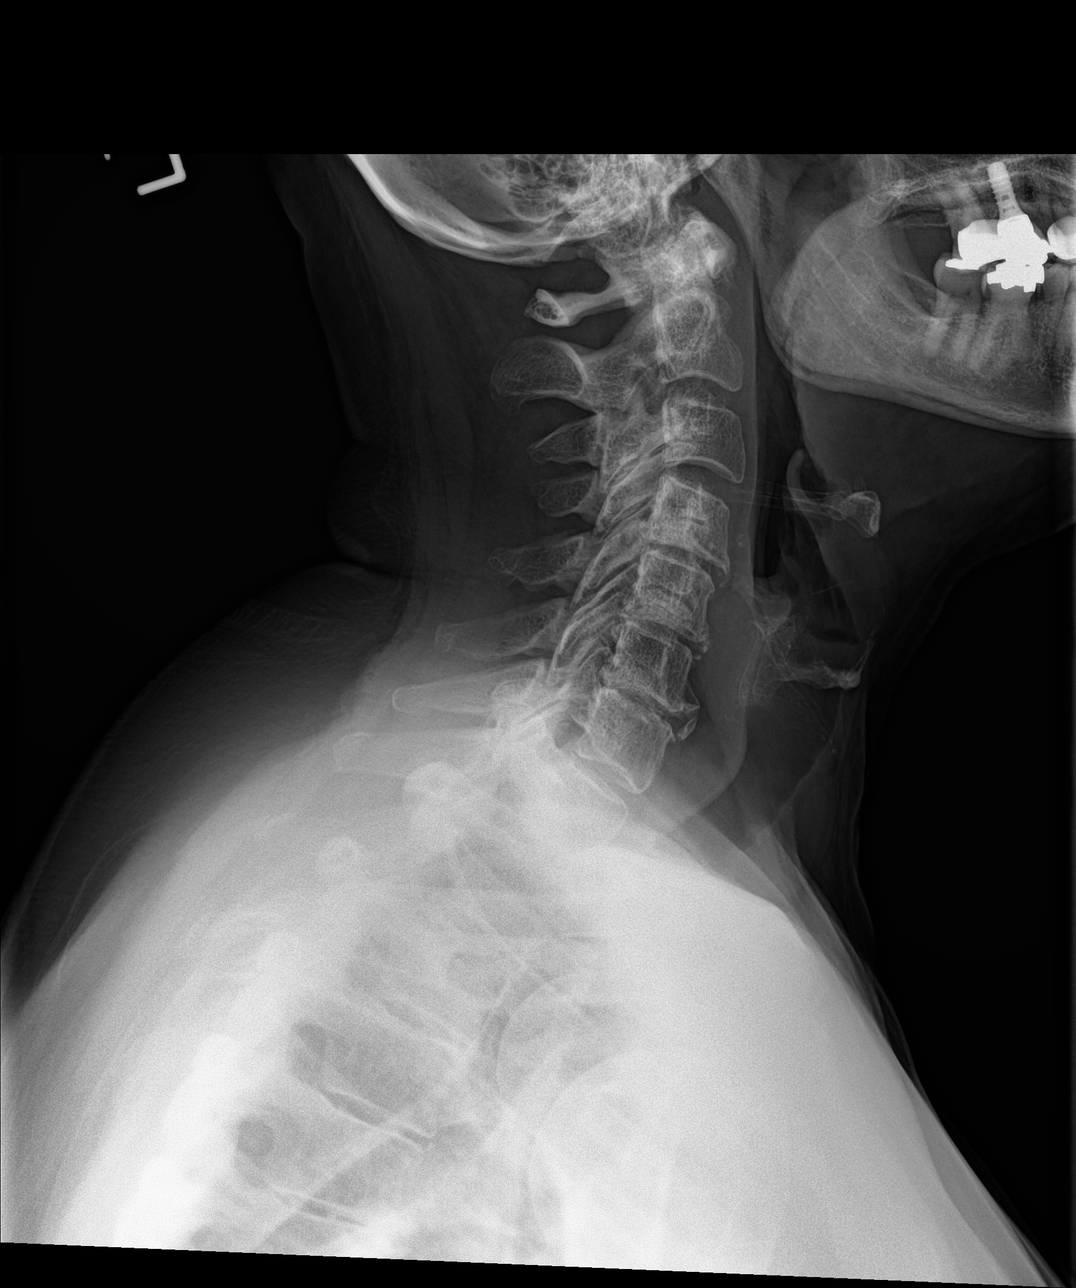

[c-spine obl (1 of 2)]
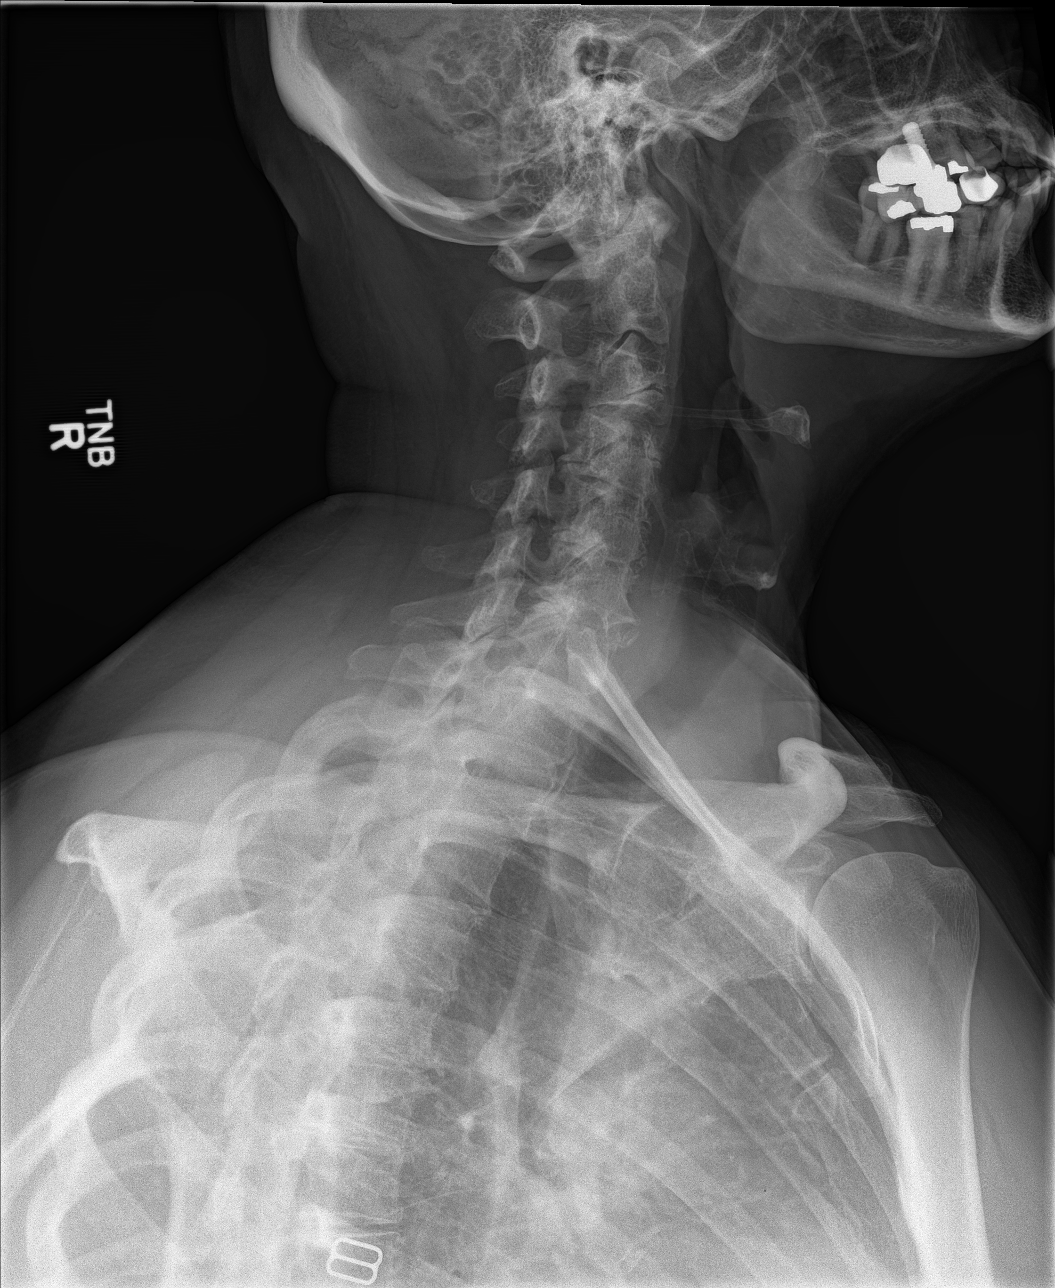

[c-spine obl (2 of 2)]
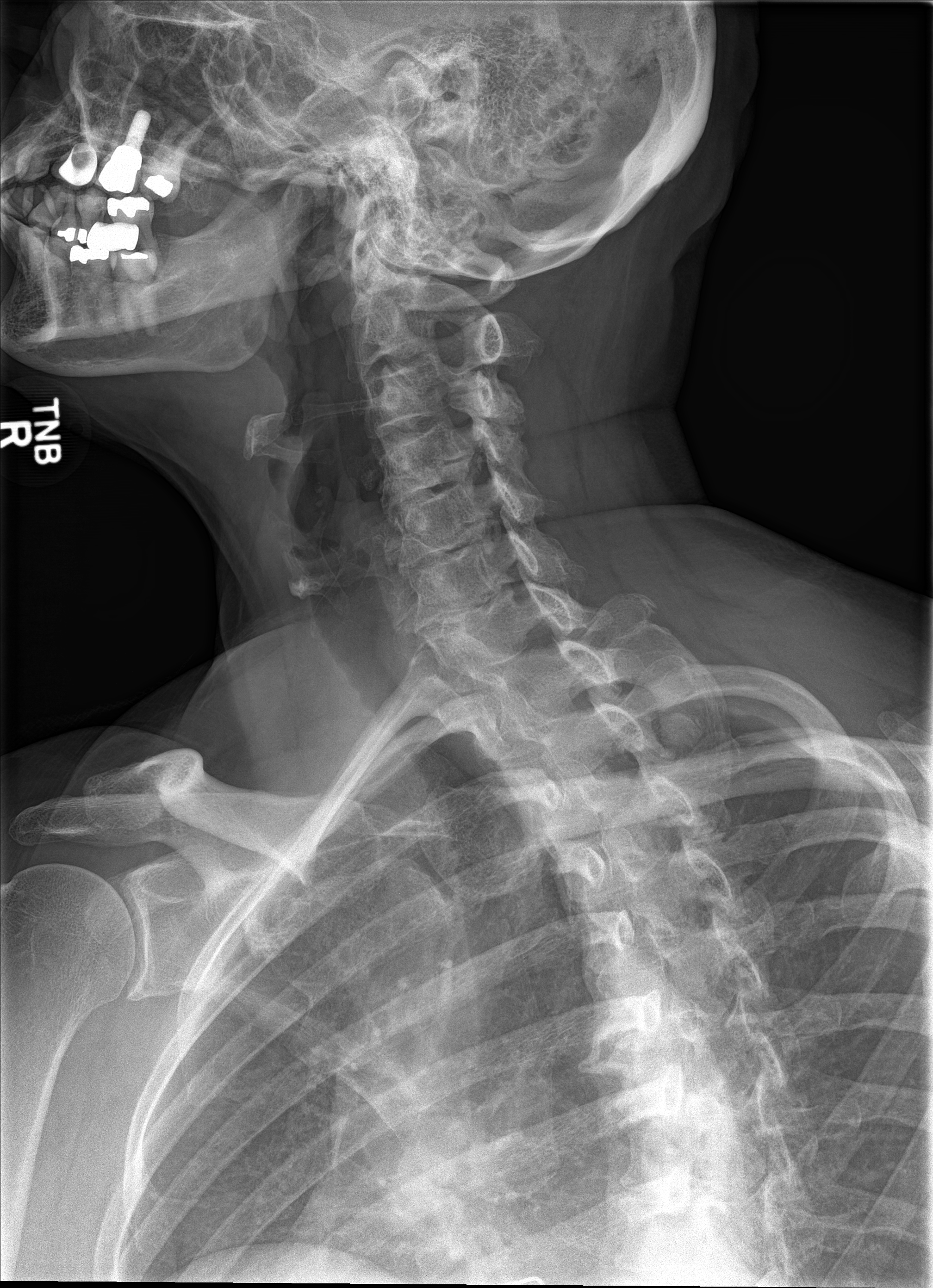

[c-spine ap]
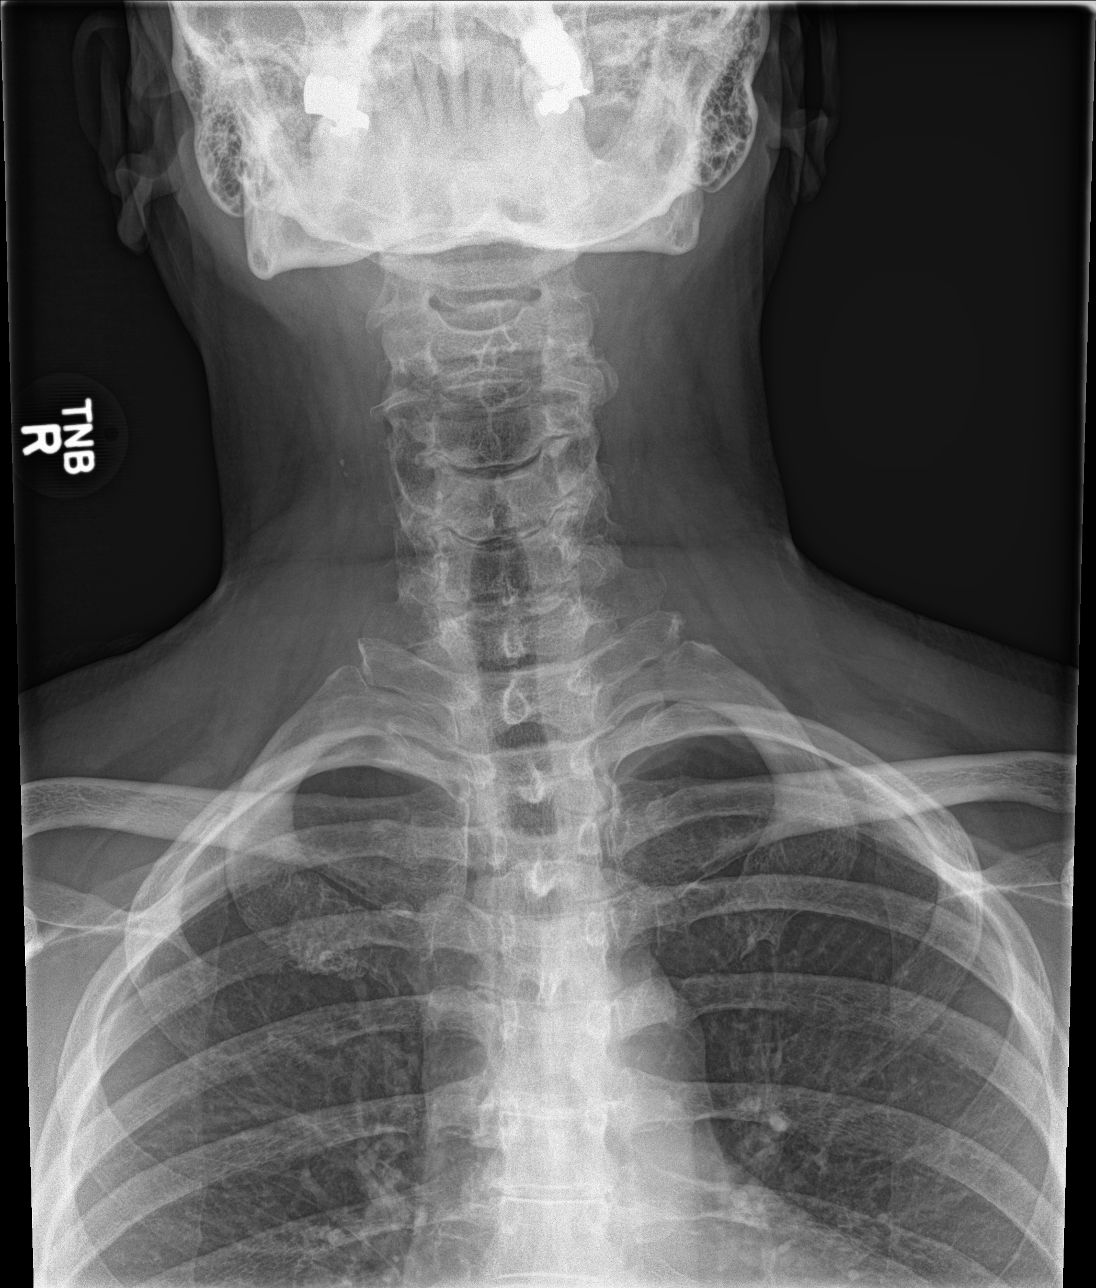

[4 of 4 positions shown; findings below may reference images not displayed]

FINDINGS: Seven cervical segments are well visualized. Vertebral body height
is well maintained. Osteophytic changes are noted from C4-C7. No
prevertebral soft tissue changes are noted. Neural foraminal
narrowing is noted on the right at C4-5, C5-6 and C6-7 and on the
left at similar levels. The odontoid is within normal limits. Facet
hypertrophic changes are seen.
IMPRESSION: Degenerative change without acute abnormality.

## 2017-05-04 DIAGNOSIS — E119 Type 2 diabetes mellitus without complications: Secondary | ICD-10-CM | POA: Diagnosis not present

## 2017-05-04 DIAGNOSIS — Z23 Encounter for immunization: Secondary | ICD-10-CM | POA: Diagnosis not present

## 2017-05-04 DIAGNOSIS — I10 Essential (primary) hypertension: Secondary | ICD-10-CM | POA: Diagnosis not present

## 2017-05-04 DIAGNOSIS — F419 Anxiety disorder, unspecified: Secondary | ICD-10-CM | POA: Diagnosis not present

## 2017-05-04 DIAGNOSIS — G4700 Insomnia, unspecified: Secondary | ICD-10-CM | POA: Diagnosis not present

## 2017-05-20 DIAGNOSIS — Z794 Long term (current) use of insulin: Secondary | ICD-10-CM | POA: Diagnosis not present

## 2017-05-20 DIAGNOSIS — E1165 Type 2 diabetes mellitus with hyperglycemia: Secondary | ICD-10-CM | POA: Diagnosis not present

## 2017-05-25 DIAGNOSIS — Z79899 Other long term (current) drug therapy: Secondary | ICD-10-CM | POA: Diagnosis not present

## 2017-05-25 DIAGNOSIS — E782 Mixed hyperlipidemia: Secondary | ICD-10-CM | POA: Diagnosis not present

## 2017-05-25 DIAGNOSIS — E039 Hypothyroidism, unspecified: Secondary | ICD-10-CM | POA: Diagnosis not present

## 2017-05-25 DIAGNOSIS — E1165 Type 2 diabetes mellitus with hyperglycemia: Secondary | ICD-10-CM | POA: Diagnosis not present

## 2017-05-25 DIAGNOSIS — I10 Essential (primary) hypertension: Secondary | ICD-10-CM | POA: Diagnosis not present

## 2017-05-25 DIAGNOSIS — Z794 Long term (current) use of insulin: Secondary | ICD-10-CM | POA: Diagnosis not present

## 2017-05-25 DIAGNOSIS — I2510 Atherosclerotic heart disease of native coronary artery without angina pectoris: Secondary | ICD-10-CM | POA: Diagnosis not present

## 2017-06-15 DIAGNOSIS — I10 Essential (primary) hypertension: Secondary | ICD-10-CM | POA: Diagnosis not present

## 2017-06-15 DIAGNOSIS — I2510 Atherosclerotic heart disease of native coronary artery without angina pectoris: Secondary | ICD-10-CM | POA: Diagnosis not present

## 2017-06-15 DIAGNOSIS — J309 Allergic rhinitis, unspecified: Secondary | ICD-10-CM | POA: Diagnosis not present

## 2017-06-15 DIAGNOSIS — E1165 Type 2 diabetes mellitus with hyperglycemia: Secondary | ICD-10-CM | POA: Diagnosis not present

## 2017-07-07 DIAGNOSIS — Z1231 Encounter for screening mammogram for malignant neoplasm of breast: Secondary | ICD-10-CM | POA: Diagnosis not present

## 2017-07-07 DIAGNOSIS — Z803 Family history of malignant neoplasm of breast: Secondary | ICD-10-CM | POA: Diagnosis not present

## 2017-07-08 DIAGNOSIS — E119 Type 2 diabetes mellitus without complications: Secondary | ICD-10-CM | POA: Diagnosis not present

## 2017-07-08 DIAGNOSIS — H25013 Cortical age-related cataract, bilateral: Secondary | ICD-10-CM | POA: Diagnosis not present

## 2017-07-08 DIAGNOSIS — H2513 Age-related nuclear cataract, bilateral: Secondary | ICD-10-CM | POA: Diagnosis not present

## 2017-08-23 DIAGNOSIS — E782 Mixed hyperlipidemia: Secondary | ICD-10-CM | POA: Diagnosis not present

## 2017-08-23 DIAGNOSIS — Z794 Long term (current) use of insulin: Secondary | ICD-10-CM | POA: Diagnosis not present

## 2017-08-23 DIAGNOSIS — E1165 Type 2 diabetes mellitus with hyperglycemia: Secondary | ICD-10-CM | POA: Diagnosis not present

## 2017-08-26 DIAGNOSIS — Z794 Long term (current) use of insulin: Secondary | ICD-10-CM | POA: Diagnosis not present

## 2017-08-26 DIAGNOSIS — Z79899 Other long term (current) drug therapy: Secondary | ICD-10-CM | POA: Diagnosis not present

## 2017-08-26 DIAGNOSIS — E782 Mixed hyperlipidemia: Secondary | ICD-10-CM | POA: Diagnosis not present

## 2017-08-26 DIAGNOSIS — I2510 Atherosclerotic heart disease of native coronary artery without angina pectoris: Secondary | ICD-10-CM | POA: Diagnosis not present

## 2017-08-26 DIAGNOSIS — E1165 Type 2 diabetes mellitus with hyperglycemia: Secondary | ICD-10-CM | POA: Diagnosis not present

## 2017-08-26 DIAGNOSIS — E039 Hypothyroidism, unspecified: Secondary | ICD-10-CM | POA: Diagnosis not present

## 2017-08-26 DIAGNOSIS — I10 Essential (primary) hypertension: Secondary | ICD-10-CM | POA: Diagnosis not present

## 2017-09-16 DIAGNOSIS — I10 Essential (primary) hypertension: Secondary | ICD-10-CM | POA: Diagnosis not present

## 2017-09-16 DIAGNOSIS — F419 Anxiety disorder, unspecified: Secondary | ICD-10-CM | POA: Diagnosis not present

## 2017-09-16 DIAGNOSIS — E119 Type 2 diabetes mellitus without complications: Secondary | ICD-10-CM | POA: Diagnosis not present

## 2017-09-16 DIAGNOSIS — I2510 Atherosclerotic heart disease of native coronary artery without angina pectoris: Secondary | ICD-10-CM | POA: Diagnosis not present

## 2017-10-07 ENCOUNTER — Ambulatory Visit (INDEPENDENT_AMBULATORY_CARE_PROVIDER_SITE_OTHER): Payer: Medicare Other | Admitting: Otolaryngology

## 2017-10-07 DIAGNOSIS — H8102 Meniere's disease, left ear: Secondary | ICD-10-CM | POA: Diagnosis not present

## 2017-10-07 DIAGNOSIS — R42 Dizziness and giddiness: Secondary | ICD-10-CM

## 2017-10-07 DIAGNOSIS — H903 Sensorineural hearing loss, bilateral: Secondary | ICD-10-CM | POA: Diagnosis not present

## 2017-10-07 DIAGNOSIS — H838X3 Other specified diseases of inner ear, bilateral: Secondary | ICD-10-CM | POA: Diagnosis not present

## 2017-11-24 DIAGNOSIS — E782 Mixed hyperlipidemia: Secondary | ICD-10-CM | POA: Diagnosis not present

## 2017-11-24 DIAGNOSIS — E1165 Type 2 diabetes mellitus with hyperglycemia: Secondary | ICD-10-CM | POA: Diagnosis not present

## 2017-11-24 DIAGNOSIS — Z794 Long term (current) use of insulin: Secondary | ICD-10-CM | POA: Diagnosis not present

## 2017-11-30 DIAGNOSIS — I252 Old myocardial infarction: Secondary | ICD-10-CM | POA: Diagnosis not present

## 2017-11-30 DIAGNOSIS — I10 Essential (primary) hypertension: Secondary | ICD-10-CM | POA: Diagnosis not present

## 2017-11-30 DIAGNOSIS — Z7982 Long term (current) use of aspirin: Secondary | ICD-10-CM | POA: Diagnosis not present

## 2017-11-30 DIAGNOSIS — E039 Hypothyroidism, unspecified: Secondary | ICD-10-CM | POA: Diagnosis not present

## 2017-11-30 DIAGNOSIS — E782 Mixed hyperlipidemia: Secondary | ICD-10-CM | POA: Diagnosis not present

## 2017-11-30 DIAGNOSIS — Z6831 Body mass index (BMI) 31.0-31.9, adult: Secondary | ICD-10-CM | POA: Diagnosis not present

## 2017-11-30 DIAGNOSIS — Z7902 Long term (current) use of antithrombotics/antiplatelets: Secondary | ICD-10-CM | POA: Diagnosis not present

## 2017-11-30 DIAGNOSIS — E1165 Type 2 diabetes mellitus with hyperglycemia: Secondary | ICD-10-CM | POA: Diagnosis not present

## 2017-11-30 DIAGNOSIS — E669 Obesity, unspecified: Secondary | ICD-10-CM | POA: Diagnosis not present

## 2017-11-30 DIAGNOSIS — Z794 Long term (current) use of insulin: Secondary | ICD-10-CM | POA: Diagnosis not present

## 2017-11-30 DIAGNOSIS — I2510 Atherosclerotic heart disease of native coronary artery without angina pectoris: Secondary | ICD-10-CM | POA: Diagnosis not present

## 2017-12-14 DIAGNOSIS — E039 Hypothyroidism, unspecified: Secondary | ICD-10-CM | POA: Diagnosis not present

## 2017-12-14 DIAGNOSIS — G4700 Insomnia, unspecified: Secondary | ICD-10-CM | POA: Diagnosis not present

## 2017-12-14 DIAGNOSIS — E785 Hyperlipidemia, unspecified: Secondary | ICD-10-CM | POA: Diagnosis not present

## 2017-12-14 DIAGNOSIS — I10 Essential (primary) hypertension: Secondary | ICD-10-CM | POA: Diagnosis not present

## 2018-01-06 DIAGNOSIS — H25013 Cortical age-related cataract, bilateral: Secondary | ICD-10-CM | POA: Diagnosis not present

## 2018-01-06 DIAGNOSIS — H2513 Age-related nuclear cataract, bilateral: Secondary | ICD-10-CM | POA: Diagnosis not present

## 2018-02-24 DIAGNOSIS — Z79899 Other long term (current) drug therapy: Secondary | ICD-10-CM | POA: Diagnosis not present

## 2018-02-24 DIAGNOSIS — E1165 Type 2 diabetes mellitus with hyperglycemia: Secondary | ICD-10-CM | POA: Diagnosis not present

## 2018-02-24 DIAGNOSIS — E039 Hypothyroidism, unspecified: Secondary | ICD-10-CM | POA: Diagnosis not present

## 2018-02-24 DIAGNOSIS — E782 Mixed hyperlipidemia: Secondary | ICD-10-CM | POA: Diagnosis not present

## 2018-02-24 DIAGNOSIS — Z794 Long term (current) use of insulin: Secondary | ICD-10-CM | POA: Diagnosis not present

## 2018-02-25 ENCOUNTER — Other Ambulatory Visit: Payer: Self-pay

## 2018-03-03 DIAGNOSIS — I10 Essential (primary) hypertension: Secondary | ICD-10-CM | POA: Diagnosis not present

## 2018-03-03 DIAGNOSIS — Z7902 Long term (current) use of antithrombotics/antiplatelets: Secondary | ICD-10-CM | POA: Diagnosis not present

## 2018-03-03 DIAGNOSIS — E782 Mixed hyperlipidemia: Secondary | ICD-10-CM | POA: Diagnosis not present

## 2018-03-03 DIAGNOSIS — Z955 Presence of coronary angioplasty implant and graft: Secondary | ICD-10-CM | POA: Diagnosis not present

## 2018-03-03 DIAGNOSIS — Z7982 Long term (current) use of aspirin: Secondary | ICD-10-CM | POA: Diagnosis not present

## 2018-03-03 DIAGNOSIS — E039 Hypothyroidism, unspecified: Secondary | ICD-10-CM | POA: Diagnosis not present

## 2018-03-03 DIAGNOSIS — E1165 Type 2 diabetes mellitus with hyperglycemia: Secondary | ICD-10-CM | POA: Diagnosis not present

## 2018-03-03 DIAGNOSIS — Z79890 Hormone replacement therapy (postmenopausal): Secondary | ICD-10-CM | POA: Diagnosis not present

## 2018-03-03 DIAGNOSIS — Z7984 Long term (current) use of oral hypoglycemic drugs: Secondary | ICD-10-CM | POA: Diagnosis not present

## 2018-03-03 DIAGNOSIS — E538 Deficiency of other specified B group vitamins: Secondary | ICD-10-CM | POA: Diagnosis not present

## 2018-03-03 DIAGNOSIS — I2510 Atherosclerotic heart disease of native coronary artery without angina pectoris: Secondary | ICD-10-CM | POA: Diagnosis not present

## 2018-03-16 ENCOUNTER — Other Ambulatory Visit (HOSPITAL_COMMUNITY): Payer: Self-pay | Admitting: Pulmonary Disease

## 2018-03-16 ENCOUNTER — Other Ambulatory Visit: Payer: Self-pay | Admitting: Pulmonary Disease

## 2018-03-16 DIAGNOSIS — I10 Essential (primary) hypertension: Secondary | ICD-10-CM | POA: Diagnosis not present

## 2018-03-16 DIAGNOSIS — Z23 Encounter for immunization: Secondary | ICD-10-CM | POA: Diagnosis not present

## 2018-03-16 DIAGNOSIS — I2510 Atherosclerotic heart disease of native coronary artery without angina pectoris: Secondary | ICD-10-CM | POA: Diagnosis not present

## 2018-03-16 DIAGNOSIS — E2839 Other primary ovarian failure: Secondary | ICD-10-CM

## 2018-03-16 DIAGNOSIS — Z780 Asymptomatic menopausal state: Secondary | ICD-10-CM

## 2018-03-16 DIAGNOSIS — K210 Gastro-esophageal reflux disease with esophagitis: Secondary | ICD-10-CM | POA: Diagnosis not present

## 2018-03-16 DIAGNOSIS — E119 Type 2 diabetes mellitus without complications: Secondary | ICD-10-CM | POA: Diagnosis not present

## 2018-03-22 DIAGNOSIS — Z780 Asymptomatic menopausal state: Secondary | ICD-10-CM | POA: Diagnosis not present

## 2018-03-22 LAB — HM DEXA SCAN: HM Dexa Scan: NORMAL

## 2018-03-23 ENCOUNTER — Other Ambulatory Visit (HOSPITAL_COMMUNITY): Payer: Medicare Other

## 2018-03-23 DIAGNOSIS — H25013 Cortical age-related cataract, bilateral: Secondary | ICD-10-CM | POA: Diagnosis not present

## 2018-03-23 DIAGNOSIS — H25043 Posterior subcapsular polar age-related cataract, bilateral: Secondary | ICD-10-CM | POA: Diagnosis not present

## 2018-03-23 DIAGNOSIS — H2513 Age-related nuclear cataract, bilateral: Secondary | ICD-10-CM | POA: Diagnosis not present

## 2018-03-23 DIAGNOSIS — H2511 Age-related nuclear cataract, right eye: Secondary | ICD-10-CM | POA: Diagnosis not present

## 2018-03-23 DIAGNOSIS — E119 Type 2 diabetes mellitus without complications: Secondary | ICD-10-CM | POA: Diagnosis not present

## 2018-05-03 ENCOUNTER — Other Ambulatory Visit: Payer: Medicare Other

## 2018-05-17 DIAGNOSIS — H25811 Combined forms of age-related cataract, right eye: Secondary | ICD-10-CM | POA: Diagnosis not present

## 2018-05-17 DIAGNOSIS — H2511 Age-related nuclear cataract, right eye: Secondary | ICD-10-CM | POA: Diagnosis not present

## 2018-05-18 DIAGNOSIS — H2512 Age-related nuclear cataract, left eye: Secondary | ICD-10-CM | POA: Diagnosis not present

## 2018-05-24 DIAGNOSIS — H25012 Cortical age-related cataract, left eye: Secondary | ICD-10-CM | POA: Diagnosis not present

## 2018-05-24 DIAGNOSIS — H25011 Cortical age-related cataract, right eye: Secondary | ICD-10-CM | POA: Diagnosis not present

## 2018-05-24 DIAGNOSIS — H2512 Age-related nuclear cataract, left eye: Secondary | ICD-10-CM | POA: Diagnosis not present

## 2018-05-24 DIAGNOSIS — H2511 Age-related nuclear cataract, right eye: Secondary | ICD-10-CM | POA: Diagnosis not present

## 2018-05-24 DIAGNOSIS — H25812 Combined forms of age-related cataract, left eye: Secondary | ICD-10-CM | POA: Diagnosis not present

## 2018-05-29 IMAGING — MR MR LUMBAR SPINE W/O CM
5 series · 43 of 48 positions shown · non-contrast
Comparison: None.

CLINICAL DATA: Low back pain over the last year. Symptoms worse on
the right.

EXAM:
MRI LUMBAR SPINE WITHOUT CONTRAST
TECHNIQUE: Multiplanar, multisequence MR imaging of the lumbar spine was
performed. No intravenous contrast was administered.

[Series 3: tirm sag · sagittal · 4.0mm · 0.55mm/px · 6 of 13 slices shown]
[im 1/13]
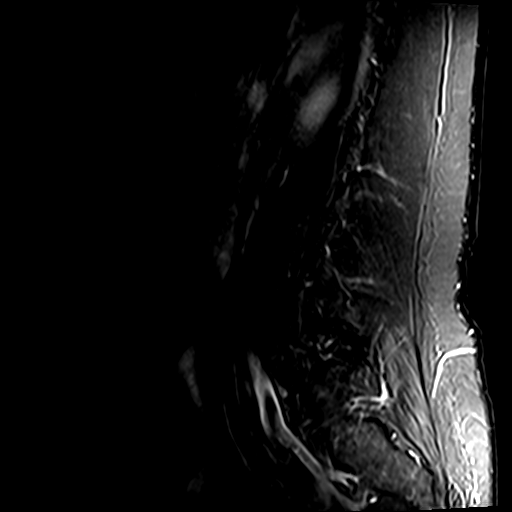
[im 3/13]
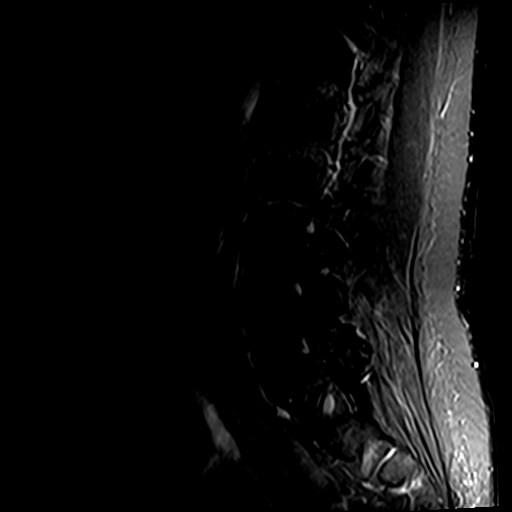
[im 5/13]
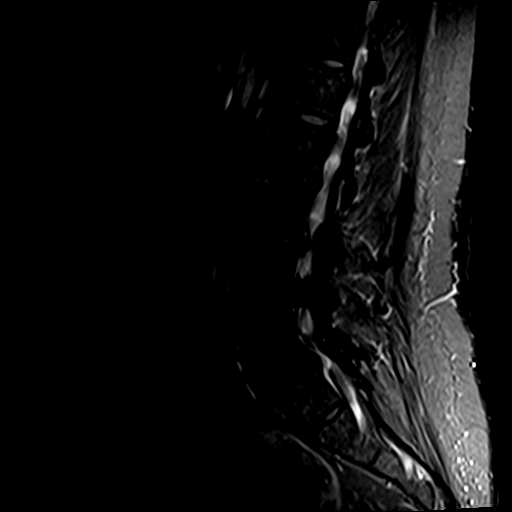
[im 8/13]
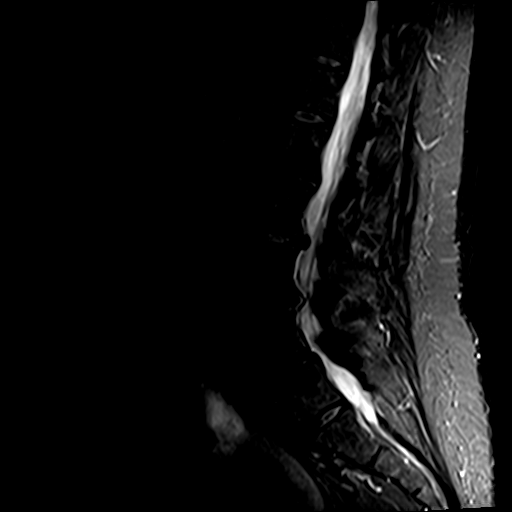
[im 10/13]
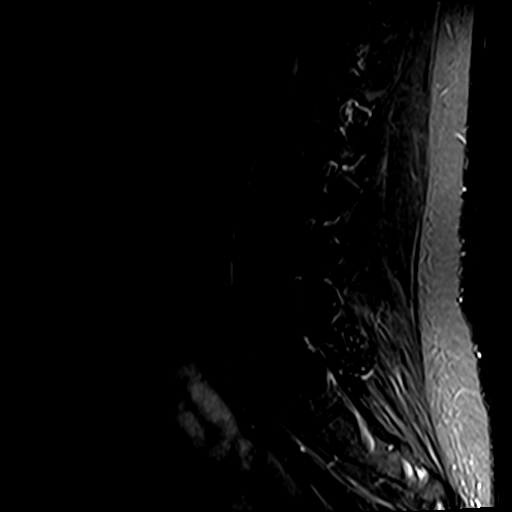
[im 13/13]
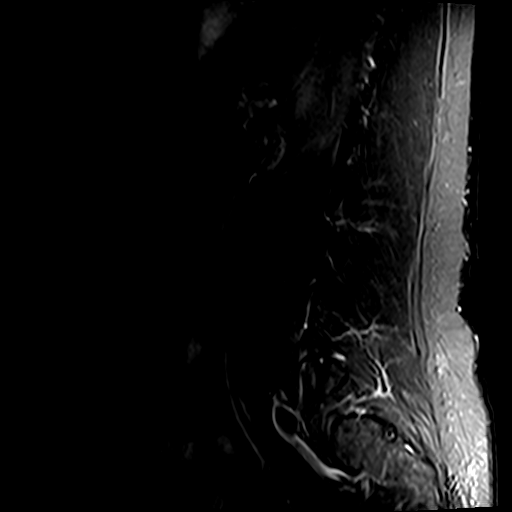

[Series 4: T2 · sagittal · 4.0mm · 0.88mm/px · 6 of 13 slices shown (1 of 2)]
[im 1/13]
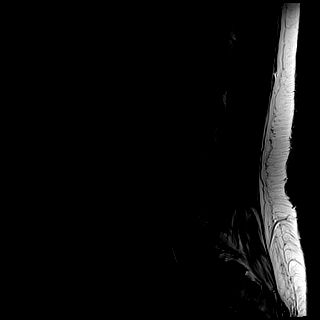
[im 3/13]
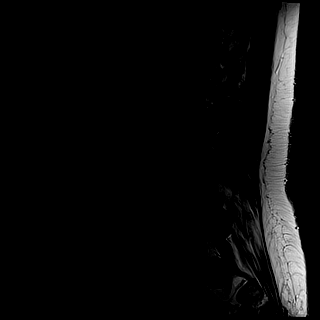
[im 5/13]
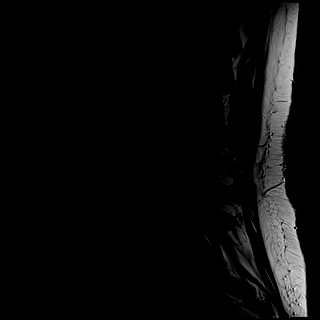
[im 8/13]
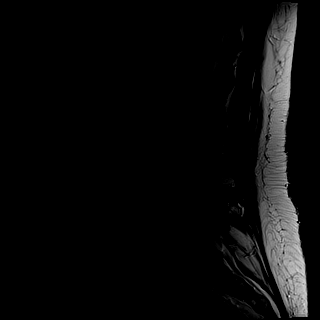
[im 10/13]
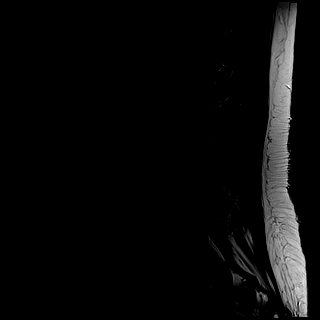
[im 13/13]
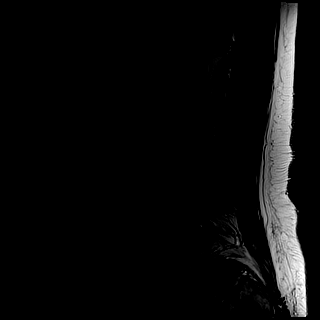

[Series 5: T1 · sagittal · 4.0mm · 0.88mm/px · 6 of 13 slices shown (1 of 2)]
[im 1/13]
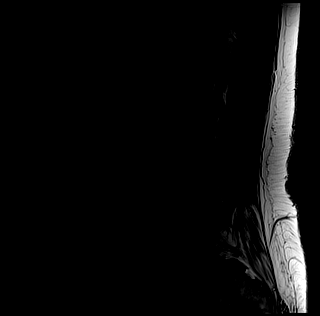
[im 3/13]
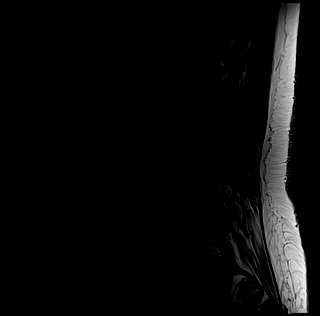
[im 5/13]
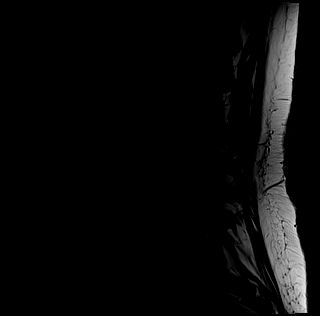
[im 8/13]
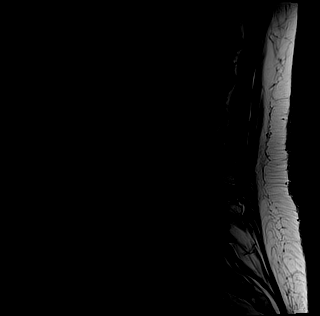
[im 10/13]
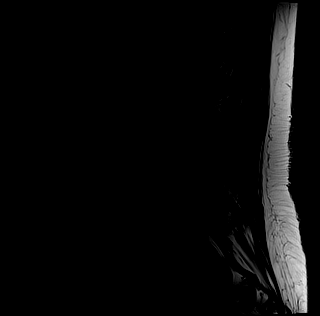
[im 13/13]
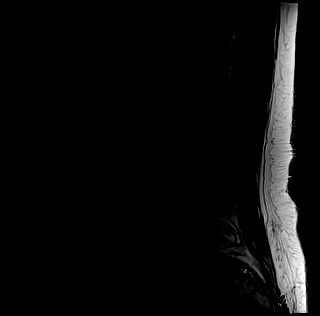

[Series 6: T1 · axial · 4.0mm · 0.78mm/px · z∈[-30,+151]mm · 10 of 32 slices shown (2 of 2)]
[im 1/32]
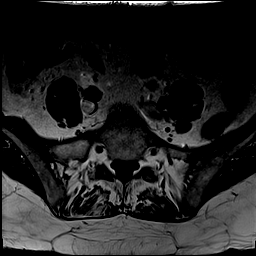
[im 3/32]
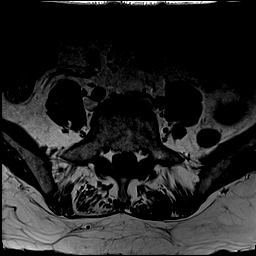
[im 5/32]
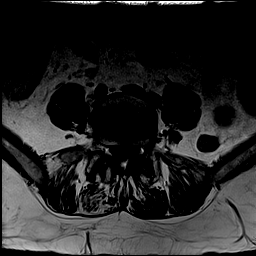
[im 9/32]
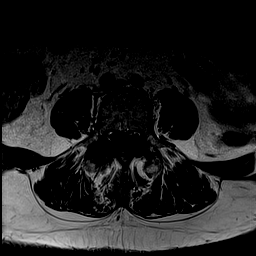
[im 14/32]
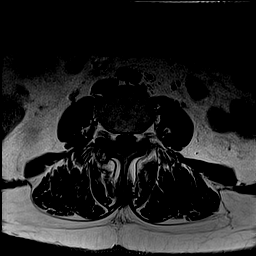
[im 16/32]
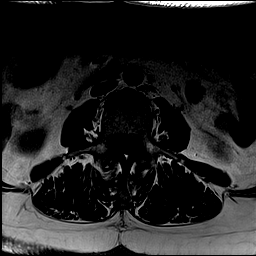
[im 18/32]
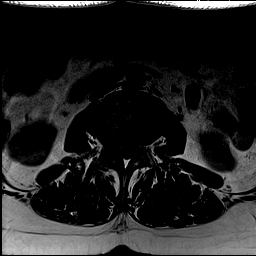
[im 23/32]
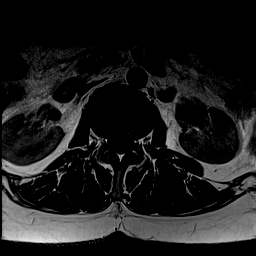
[im 27/32]
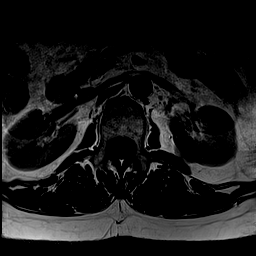
[im 32/32]
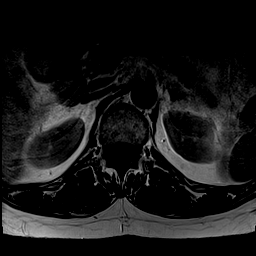

[Series 7: T2 · axial · 4.0mm · 0.78mm/px · z∈[-30,+151]mm · 15 of 32 slices shown (2 of 2)]
[im 1/32]
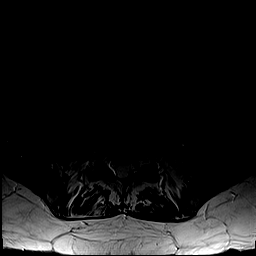
[im 3/32]
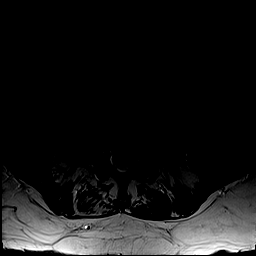
[im 5/32]
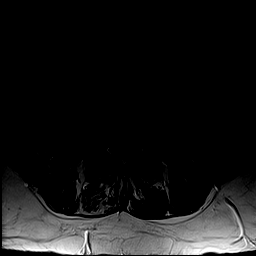
[im 7/32]
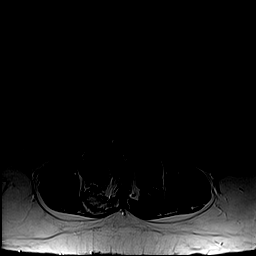
[im 9/32]
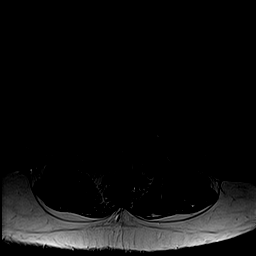
[im 12/32]
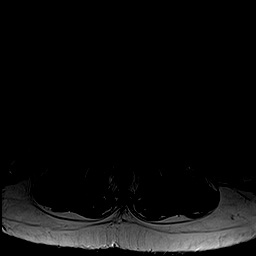
[im 14/32]
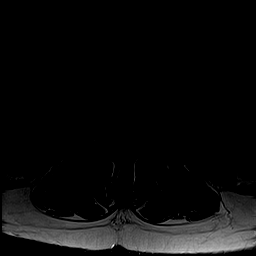
[im 16/32]
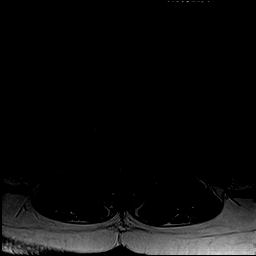
[im 18/32]
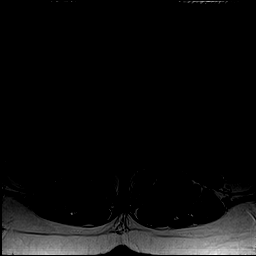
[im 20/32]
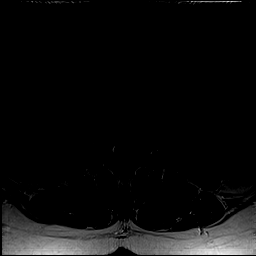
[im 23/32]
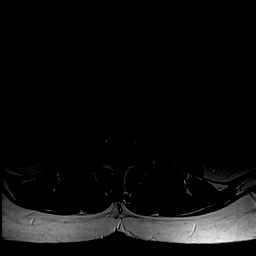
[im 25/32]
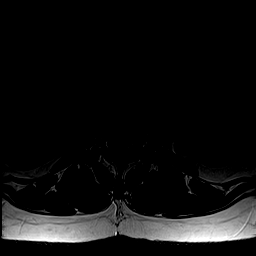
[im 27/32]
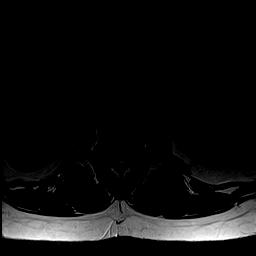
[im 29/32]
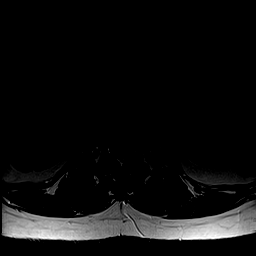
[im 32/32]
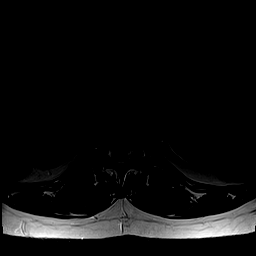

[43 of 48 positions shown; findings below may reference images not displayed]

FINDINGS: Segmentation:  S1 is described as a transitional vertebra.

Alignment:  Anterolisthesis L5-S1 of 7 mm.

Vertebrae:  No fracture or primary bone lesion.

Conus medullaris: Extends to the L2 level and appears normal.

Paraspinal and other soft tissues: Negative

Disc levels:

T12-L1 and L1-2:  Normal.

L2-3:  Mild bulging of the disc.  No canal or foraminal stenosis.

L3-4: Broad-based disc herniation with slight caudal down turning on
the right. Facet and ligamentous hypertrophy. Spinal stenosis with
potential for neural compression in both lateral recesses.

L4-5: Broad-based disc herniation with slight caudal down turning.
Mild facet and ligamentous hypertrophy. Spinal stenosis with
potential for neural compression and both lateral recesses.

L5-S1: Advanced bilateral facet arthropathy with 7 mm of
anterolisthesis. No disc herniation. Stenosis of the subarticular
lateral recesses that could cause neural compression.

S1-2:  Transitional and normal.
IMPRESSION: S1 is described as a transitional vertebra.

L3-4: Broad-based disc herniation with caudal down turning more on
the right. Facet and ligamentous hypertrophy. Spinal stenosis with
potential for neural compression in both lateral recesses.

L4-5: Broad-based disc herniation with slight caudal down turning.
Bilateral facet hypertrophy. Spinal stenosis with potential for
neural compression and both lateral recesses.

L5-S1: Advanced bilateral facet arthropathy with 7 mm of
anterolisthesis. Stenosis of the subarticular lateral recesses
bilaterally that could cause neural compression.

## 2018-05-31 DIAGNOSIS — H25011 Cortical age-related cataract, right eye: Secondary | ICD-10-CM | POA: Diagnosis not present

## 2018-05-31 DIAGNOSIS — H2511 Age-related nuclear cataract, right eye: Secondary | ICD-10-CM | POA: Diagnosis not present

## 2018-06-03 DIAGNOSIS — Z794 Long term (current) use of insulin: Secondary | ICD-10-CM | POA: Diagnosis not present

## 2018-06-03 DIAGNOSIS — E782 Mixed hyperlipidemia: Secondary | ICD-10-CM | POA: Diagnosis not present

## 2018-06-03 DIAGNOSIS — E1165 Type 2 diabetes mellitus with hyperglycemia: Secondary | ICD-10-CM | POA: Diagnosis not present

## 2018-06-06 DIAGNOSIS — E538 Deficiency of other specified B group vitamins: Secondary | ICD-10-CM | POA: Diagnosis not present

## 2018-06-06 DIAGNOSIS — I10 Essential (primary) hypertension: Secondary | ICD-10-CM | POA: Diagnosis not present

## 2018-06-06 DIAGNOSIS — E782 Mixed hyperlipidemia: Secondary | ICD-10-CM | POA: Diagnosis not present

## 2018-06-06 DIAGNOSIS — E1165 Type 2 diabetes mellitus with hyperglycemia: Secondary | ICD-10-CM | POA: Diagnosis not present

## 2018-06-06 DIAGNOSIS — I2510 Atherosclerotic heart disease of native coronary artery without angina pectoris: Secondary | ICD-10-CM | POA: Diagnosis not present

## 2018-06-06 DIAGNOSIS — E039 Hypothyroidism, unspecified: Secondary | ICD-10-CM | POA: Diagnosis not present

## 2018-06-06 DIAGNOSIS — Z79899 Other long term (current) drug therapy: Secondary | ICD-10-CM | POA: Diagnosis not present

## 2018-06-06 DIAGNOSIS — Z794 Long term (current) use of insulin: Secondary | ICD-10-CM | POA: Diagnosis not present

## 2018-06-07 DIAGNOSIS — H25012 Cortical age-related cataract, left eye: Secondary | ICD-10-CM | POA: Diagnosis not present

## 2018-06-07 DIAGNOSIS — H25011 Cortical age-related cataract, right eye: Secondary | ICD-10-CM | POA: Diagnosis not present

## 2018-06-07 DIAGNOSIS — H2511 Age-related nuclear cataract, right eye: Secondary | ICD-10-CM | POA: Diagnosis not present

## 2018-06-07 DIAGNOSIS — H2512 Age-related nuclear cataract, left eye: Secondary | ICD-10-CM | POA: Diagnosis not present

## 2018-06-14 DIAGNOSIS — I10 Essential (primary) hypertension: Secondary | ICD-10-CM | POA: Diagnosis not present

## 2018-06-14 DIAGNOSIS — I2510 Atherosclerotic heart disease of native coronary artery without angina pectoris: Secondary | ICD-10-CM | POA: Diagnosis not present

## 2018-06-14 DIAGNOSIS — E785 Hyperlipidemia, unspecified: Secondary | ICD-10-CM | POA: Diagnosis not present

## 2018-06-14 DIAGNOSIS — Z23 Encounter for immunization: Secondary | ICD-10-CM | POA: Diagnosis not present

## 2018-06-14 DIAGNOSIS — E119 Type 2 diabetes mellitus without complications: Secondary | ICD-10-CM | POA: Diagnosis not present

## 2018-08-19 DIAGNOSIS — Z803 Family history of malignant neoplasm of breast: Secondary | ICD-10-CM | POA: Diagnosis not present

## 2018-08-19 DIAGNOSIS — Z1231 Encounter for screening mammogram for malignant neoplasm of breast: Secondary | ICD-10-CM | POA: Diagnosis not present

## 2018-08-19 LAB — HM MAMMOGRAPHY

## 2018-08-30 DIAGNOSIS — E782 Mixed hyperlipidemia: Secondary | ICD-10-CM | POA: Diagnosis not present

## 2018-08-30 DIAGNOSIS — E1165 Type 2 diabetes mellitus with hyperglycemia: Secondary | ICD-10-CM | POA: Diagnosis not present

## 2018-08-30 DIAGNOSIS — Z794 Long term (current) use of insulin: Secondary | ICD-10-CM | POA: Diagnosis not present

## 2018-09-05 DIAGNOSIS — I2510 Atherosclerotic heart disease of native coronary artery without angina pectoris: Secondary | ICD-10-CM | POA: Diagnosis not present

## 2018-09-05 DIAGNOSIS — E538 Deficiency of other specified B group vitamins: Secondary | ICD-10-CM | POA: Diagnosis not present

## 2018-09-05 DIAGNOSIS — E039 Hypothyroidism, unspecified: Secondary | ICD-10-CM | POA: Diagnosis not present

## 2018-09-05 DIAGNOSIS — E1165 Type 2 diabetes mellitus with hyperglycemia: Secondary | ICD-10-CM | POA: Diagnosis not present

## 2018-09-05 DIAGNOSIS — E782 Mixed hyperlipidemia: Secondary | ICD-10-CM | POA: Diagnosis not present

## 2018-09-05 DIAGNOSIS — Z79899 Other long term (current) drug therapy: Secondary | ICD-10-CM | POA: Diagnosis not present

## 2018-09-05 DIAGNOSIS — Z794 Long term (current) use of insulin: Secondary | ICD-10-CM | POA: Diagnosis not present

## 2018-09-05 DIAGNOSIS — I10 Essential (primary) hypertension: Secondary | ICD-10-CM | POA: Diagnosis not present

## 2018-09-12 DIAGNOSIS — H11422 Conjunctival edema, left eye: Secondary | ICD-10-CM | POA: Diagnosis not present

## 2018-09-12 DIAGNOSIS — H1045 Other chronic allergic conjunctivitis: Secondary | ICD-10-CM | POA: Diagnosis not present

## 2018-09-12 DIAGNOSIS — Z961 Presence of intraocular lens: Secondary | ICD-10-CM | POA: Diagnosis not present

## 2018-09-14 DIAGNOSIS — E785 Hyperlipidemia, unspecified: Secondary | ICD-10-CM | POA: Diagnosis not present

## 2018-09-14 DIAGNOSIS — E1169 Type 2 diabetes mellitus with other specified complication: Secondary | ICD-10-CM | POA: Diagnosis not present

## 2018-09-14 DIAGNOSIS — I10 Essential (primary) hypertension: Secondary | ICD-10-CM | POA: Diagnosis not present

## 2018-09-14 DIAGNOSIS — I2510 Atherosclerotic heart disease of native coronary artery without angina pectoris: Secondary | ICD-10-CM | POA: Diagnosis not present

## 2018-09-19 DIAGNOSIS — H1045 Other chronic allergic conjunctivitis: Secondary | ICD-10-CM | POA: Diagnosis not present

## 2018-09-19 DIAGNOSIS — H11422 Conjunctival edema, left eye: Secondary | ICD-10-CM | POA: Diagnosis not present

## 2018-09-19 DIAGNOSIS — Z961 Presence of intraocular lens: Secondary | ICD-10-CM | POA: Diagnosis not present

## 2019-01-20 DIAGNOSIS — E538 Deficiency of other specified B group vitamins: Secondary | ICD-10-CM | POA: Diagnosis not present

## 2019-01-20 DIAGNOSIS — E1165 Type 2 diabetes mellitus with hyperglycemia: Secondary | ICD-10-CM | POA: Diagnosis not present

## 2019-01-20 DIAGNOSIS — Z794 Long term (current) use of insulin: Secondary | ICD-10-CM | POA: Diagnosis not present

## 2019-01-20 DIAGNOSIS — E782 Mixed hyperlipidemia: Secondary | ICD-10-CM | POA: Diagnosis not present

## 2019-01-23 DIAGNOSIS — E1165 Type 2 diabetes mellitus with hyperglycemia: Secondary | ICD-10-CM | POA: Diagnosis not present

## 2019-01-23 DIAGNOSIS — E039 Hypothyroidism, unspecified: Secondary | ICD-10-CM | POA: Diagnosis not present

## 2019-01-23 DIAGNOSIS — I2510 Atherosclerotic heart disease of native coronary artery without angina pectoris: Secondary | ICD-10-CM | POA: Diagnosis not present

## 2019-01-23 DIAGNOSIS — Z794 Long term (current) use of insulin: Secondary | ICD-10-CM | POA: Diagnosis not present

## 2019-01-23 DIAGNOSIS — E782 Mixed hyperlipidemia: Secondary | ICD-10-CM | POA: Diagnosis not present

## 2019-01-23 DIAGNOSIS — E538 Deficiency of other specified B group vitamins: Secondary | ICD-10-CM | POA: Diagnosis not present

## 2019-01-23 DIAGNOSIS — I10 Essential (primary) hypertension: Secondary | ICD-10-CM | POA: Diagnosis not present

## 2019-01-26 DIAGNOSIS — I2510 Atherosclerotic heart disease of native coronary artery without angina pectoris: Secondary | ICD-10-CM | POA: Diagnosis not present

## 2019-01-26 DIAGNOSIS — E119 Type 2 diabetes mellitus without complications: Secondary | ICD-10-CM | POA: Diagnosis not present

## 2019-01-26 DIAGNOSIS — E538 Deficiency of other specified B group vitamins: Secondary | ICD-10-CM | POA: Diagnosis not present

## 2019-01-26 DIAGNOSIS — E039 Hypothyroidism, unspecified: Secondary | ICD-10-CM | POA: Diagnosis not present

## 2019-02-23 ENCOUNTER — Other Ambulatory Visit: Payer: Self-pay

## 2019-04-25 DIAGNOSIS — E1165 Type 2 diabetes mellitus with hyperglycemia: Secondary | ICD-10-CM | POA: Diagnosis not present

## 2019-04-25 DIAGNOSIS — E782 Mixed hyperlipidemia: Secondary | ICD-10-CM | POA: Diagnosis not present

## 2019-04-25 DIAGNOSIS — E538 Deficiency of other specified B group vitamins: Secondary | ICD-10-CM | POA: Diagnosis not present

## 2019-04-25 DIAGNOSIS — Z794 Long term (current) use of insulin: Secondary | ICD-10-CM | POA: Diagnosis not present

## 2019-05-01 DIAGNOSIS — Z23 Encounter for immunization: Secondary | ICD-10-CM | POA: Diagnosis not present

## 2019-05-02 DIAGNOSIS — I10 Essential (primary) hypertension: Secondary | ICD-10-CM | POA: Diagnosis not present

## 2019-05-02 DIAGNOSIS — E1165 Type 2 diabetes mellitus with hyperglycemia: Secondary | ICD-10-CM | POA: Diagnosis not present

## 2019-05-02 DIAGNOSIS — Z79899 Other long term (current) drug therapy: Secondary | ICD-10-CM | POA: Diagnosis not present

## 2019-05-02 DIAGNOSIS — E782 Mixed hyperlipidemia: Secondary | ICD-10-CM | POA: Diagnosis not present

## 2019-05-02 DIAGNOSIS — I2510 Atherosclerotic heart disease of native coronary artery without angina pectoris: Secondary | ICD-10-CM | POA: Diagnosis not present

## 2019-05-02 DIAGNOSIS — E538 Deficiency of other specified B group vitamins: Secondary | ICD-10-CM | POA: Diagnosis not present

## 2019-05-02 DIAGNOSIS — E039 Hypothyroidism, unspecified: Secondary | ICD-10-CM | POA: Diagnosis not present

## 2019-05-02 DIAGNOSIS — Z794 Long term (current) use of insulin: Secondary | ICD-10-CM | POA: Diagnosis not present

## 2019-05-30 DIAGNOSIS — I2510 Atherosclerotic heart disease of native coronary artery without angina pectoris: Secondary | ICD-10-CM | POA: Diagnosis not present

## 2019-05-30 DIAGNOSIS — E039 Hypothyroidism, unspecified: Secondary | ICD-10-CM | POA: Diagnosis not present

## 2019-05-30 DIAGNOSIS — E119 Type 2 diabetes mellitus without complications: Secondary | ICD-10-CM | POA: Diagnosis not present

## 2019-05-30 DIAGNOSIS — I10 Essential (primary) hypertension: Secondary | ICD-10-CM | POA: Diagnosis not present

## 2019-06-17 DIAGNOSIS — M797 Fibromyalgia: Secondary | ICD-10-CM | POA: Insufficient documentation

## 2019-06-17 DIAGNOSIS — F5101 Primary insomnia: Secondary | ICD-10-CM

## 2019-06-17 DIAGNOSIS — E1165 Type 2 diabetes mellitus with hyperglycemia: Secondary | ICD-10-CM | POA: Insufficient documentation

## 2019-06-17 DIAGNOSIS — M62838 Other muscle spasm: Secondary | ICD-10-CM | POA: Insufficient documentation

## 2019-06-17 DIAGNOSIS — F419 Anxiety disorder, unspecified: Secondary | ICD-10-CM | POA: Insufficient documentation

## 2019-06-17 DIAGNOSIS — E039 Hypothyroidism, unspecified: Secondary | ICD-10-CM | POA: Insufficient documentation

## 2019-06-17 DIAGNOSIS — I119 Hypertensive heart disease without heart failure: Secondary | ICD-10-CM | POA: Insufficient documentation

## 2019-06-17 DIAGNOSIS — J309 Allergic rhinitis, unspecified: Secondary | ICD-10-CM | POA: Insufficient documentation

## 2019-06-17 DIAGNOSIS — I2510 Atherosclerotic heart disease of native coronary artery without angina pectoris: Secondary | ICD-10-CM | POA: Insufficient documentation

## 2019-06-17 DIAGNOSIS — F321 Major depressive disorder, single episode, moderate: Secondary | ICD-10-CM

## 2019-06-17 DIAGNOSIS — R0602 Shortness of breath: Secondary | ICD-10-CM

## 2019-06-17 DIAGNOSIS — R0789 Other chest pain: Secondary | ICD-10-CM | POA: Insufficient documentation

## 2019-06-17 DIAGNOSIS — I10 Essential (primary) hypertension: Secondary | ICD-10-CM

## 2019-06-17 DIAGNOSIS — E785 Hyperlipidemia, unspecified: Secondary | ICD-10-CM

## 2019-06-19 ENCOUNTER — Other Ambulatory Visit (HOSPITAL_COMMUNITY): Payer: Self-pay | Admitting: Pulmonary Disease

## 2019-06-19 ENCOUNTER — Other Ambulatory Visit: Payer: Self-pay | Admitting: Pulmonary Disease

## 2019-06-19 DIAGNOSIS — R1011 Right upper quadrant pain: Secondary | ICD-10-CM | POA: Diagnosis not present

## 2019-06-19 DIAGNOSIS — E039 Hypothyroidism, unspecified: Secondary | ICD-10-CM | POA: Diagnosis not present

## 2019-06-19 DIAGNOSIS — I2510 Atherosclerotic heart disease of native coronary artery without angina pectoris: Secondary | ICD-10-CM | POA: Diagnosis not present

## 2019-06-19 DIAGNOSIS — I10 Essential (primary) hypertension: Secondary | ICD-10-CM | POA: Diagnosis not present

## 2019-06-26 ENCOUNTER — Ambulatory Visit (HOSPITAL_COMMUNITY)
Admission: RE | Admit: 2019-06-26 | Discharge: 2019-06-26 | Disposition: A | Discharge status: Home / Self Care | Payer: Medicare Other | Source: Ambulatory Visit | Attending: Pulmonary Disease | Admitting: Pulmonary Disease

## 2019-06-26 ENCOUNTER — Other Ambulatory Visit: Payer: Self-pay

## 2019-06-26 DIAGNOSIS — R1011 Right upper quadrant pain: Secondary | ICD-10-CM | POA: Insufficient documentation

## 2019-07-07 ENCOUNTER — Other Ambulatory Visit: Payer: Self-pay

## 2019-07-07 ENCOUNTER — Encounter: Payer: Self-pay | Admitting: Cardiology

## 2019-07-07 ENCOUNTER — Ambulatory Visit (INDEPENDENT_AMBULATORY_CARE_PROVIDER_SITE_OTHER): Payer: Medicare Other | Admitting: Cardiology

## 2019-07-07 VITALS — BP 122/79 | HR 77 | Temp 97.6°F | Ht 64.0 in | Wt 198.0 lb

## 2019-07-07 DIAGNOSIS — E782 Mixed hyperlipidemia: Secondary | ICD-10-CM

## 2019-07-07 DIAGNOSIS — I2510 Atherosclerotic heart disease of native coronary artery without angina pectoris: Secondary | ICD-10-CM | POA: Diagnosis not present

## 2019-07-07 DIAGNOSIS — I10 Essential (primary) hypertension: Secondary | ICD-10-CM | POA: Diagnosis not present

## 2019-07-07 NOTE — Patient Instructions (Signed)

## 2019-07-07 NOTE — Progress Notes (Signed)
Clinical Summary Ms. Desilva is a 68 y.o.female seen as new patient, last seen by Dr Gwenlyn Found in 2013    1. CAD - history of posterior MI in April 2005, stents to LCX   2013 cath: LM normal, LAD normal, LCX patent stent, nondom RCA 50% 01/2012 echo LVEF >63%, grade I diastolic dysfunction,   - no recent chest pain. No SOB or DOE - compliant with meds. Has been plavix since 2005. ??DAPT   2. HTN - compliant with meds.    3. Hyperlipidemia - 03/2019 TC 153 TG 193 HDL 37 LDL 81 - she is on zocor 40mg  for several years.   4. Mitral regurgiation - mild by 01/2012 echo  5. DM2 - followed by enodcrine.    SH: stepson is Lucia Bitter, a patient of mine   Past Medical History:  Diagnosis Date  . Allergic rhinitis, unspecified   . Anxiety disorder, unspecified   . Atherosclerotic heart disease of native coronary artery without angina pectoris   . Coronary artery disease    cardiac catheterization 03/01/12, 11/14/03, 10/10/2003, 02/21/1999, 08/14/1985  . Diabetes mellitus ([place])    non-insulin dependent  . Essential (primary) hypertension   . Fibromyalgia   . Fibromyalgia   . Hyperlipidemia, mixed   . Hyperlipidemia, unspecified   . Hypertension   . Hypertensive heart disease without heart failure   . Hypothyroidism   . Hypothyroidism, unspecified   . Major depressive disorder, single episode, moderate ([place])   . Other chest pain   . Other muscle spasm   . Primary insomnia   . Shortness of breath    2D Echocardiogram 02/03/12 with EF of greater than 55%  . Shortness of breath   . Type 2 diabetes mellitus with hyperglycemia ([place])   . Type 2 diabetes mellitus with hyperglycemia ([facility])      Allergies  Allergen Reactions  . Sulfa Antibiotics Other (See Comments)    unknown     Current Outpatient Medications  Medication Sig Dispense Refill  . carvedilol (COREG) 12.5 MG tablet Take 12.5 mg by mouth 2 (two) times daily with a meal.    . clobetasol (TEMOVATE) 0.05 %  external solution Apply 1 application topically 2 (two) times daily as needed. Itchy scalp    . clopidogrel (PLAVIX) 75 MG tablet Take 75 mg by mouth daily.    . Doxepin HCl (SILENOR) 3 MG TABS Take 0.5 tablets by mouth at bedtime as needed.    Marland Kitchen glipiZIDE (GLUCOTROL XL) 5 MG 24 hr tablet Take 1 tablet by mouth at bedtime.    Marland Kitchen levothyroxine (SYNTHROID, LEVOTHROID) 88 MCG tablet Take 88 mcg by mouth daily.    Marland Kitchen lisinopril (PRINIVIL,ZESTRIL) 5 MG tablet Take 5 mg by mouth daily.    . metFORMIN (GLUCOPHAGE-XR) 500 MG 24 hr tablet Take 1 tablet by mouth 2 (two) times daily.    . montelukast (SINGULAIR) 10 MG tablet Take 10 mg by mouth at bedtime.    . pioglitazone (ACTOS) 15 MG tablet Take 1 tablet by mouth daily. For diabetes    . simvastatin (ZOCOR) 40 MG tablet Take 40 mg by mouth every evening.     No current facility-administered medications for this visit.        Allergies  Allergen Reactions  . Sulfa Antibiotics Other (See Comments)    unknown      Family History  Problem Relation Age of Onset  . Cancer Mother        breast  cancer deceased in her 62's  . Coronary artery disease Father        myocardial infarction 09/2003     Social History Ms. Santiago reports that she has never smoked. She does not have any smokeless tobacco history on file. Ms. Riedlinger reports no history of alcohol use.   Review of Systems CONSTITUTIONAL: No weight loss, fever, chills, weakness or fatigue.  HEENT: Eyes: No visual loss, blurred vision, double vision or yellow sclerae.No hearing loss, sneezing, congestion, runny nose or sore throat.  SKIN: No rash or itching.  CARDIOVASCULAR: per hpi RESPIRATORY: No shortness of breath, cough or sputum.  GASTROINTESTINAL: No anorexia, nausea, vomiting or diarrhea. No abdominal pain or blood.  GENITOURINARY: No burning on urination, no polyuria NEUROLOGICAL: No headache, dizziness, syncope, paralysis, ataxia, numbness or tingling in the extremities. No  change in bowel or bladder control.  MUSCULOSKELETAL: No muscle, back pain, joint pain or stiffness.  LYMPHATICS: No enlarged nodes. No history of splenectomy.  PSYCHIATRIC: No history of depression or anxiety.  ENDOCRINOLOGIC: No reports of sweating, cold or heat intolerance. No polyuria or polydipsia.  Marland Kitchen   Physical Examination Today's Vitals   07/07/19 1300  BP: 122/79  Pulse: 77  Temp: 97.6 F (36.4 C)  TempSrc: Temporal  SpO2: 98%  Weight: 198 lb (89.8 kg)  Height: 5\' 4"  (1.626 m)   Body mass index is 33.99 kg/m.  Gen: resting comfortably, no acute distress HEENT: no scleral icterus, pupils equal round and reactive, no palptable cervical adenopathy,  CV: RRR, no m/r/g, no jvd Resp: Clear to auscultation bilaterally GI: abdomen is soft, non-tender, non-distended, normal bowel sounds, no hepatosplenomegaly MSK: extremities are warm, no edema.  Skin: warm, no rash Neuro:  no focal deficits Psych: appropriate affect    Assessment and Plan   1. CAD - no recent symptoms, continue current meds   2. HTN - at goal, continue current meds  3. Hyperlipidemia - reasonable control, continue current meds    F/u 6 months     Arnoldo Lenis, M.D

## 2019-07-17 ENCOUNTER — Ambulatory Visit (INDEPENDENT_AMBULATORY_CARE_PROVIDER_SITE_OTHER): Payer: Medicare Other | Admitting: Family Medicine

## 2019-07-17 ENCOUNTER — Encounter: Payer: Self-pay | Admitting: Family Medicine

## 2019-07-17 ENCOUNTER — Other Ambulatory Visit: Payer: Self-pay

## 2019-07-17 VITALS — BP 140/85 | HR 71 | Temp 98.1°F | Ht 64.0 in | Wt 199.4 lb

## 2019-07-17 DIAGNOSIS — I119 Hypertensive heart disease without heart failure: Secondary | ICD-10-CM | POA: Diagnosis not present

## 2019-07-17 DIAGNOSIS — E039 Hypothyroidism, unspecified: Secondary | ICD-10-CM | POA: Diagnosis not present

## 2019-07-17 DIAGNOSIS — E785 Hyperlipidemia, unspecified: Secondary | ICD-10-CM | POA: Diagnosis not present

## 2019-07-17 NOTE — Progress Notes (Signed)
New Patient Office Visit  Subjective:  Patient ID: Meagan Molina, female    DOB: 12/05/1950  Age: 68 y.o. MRN: [id number]  CC:  Chief Complaint  Patient presents with  . Establish Care  . Obesity  HTN/Hyperlipidemia  HPI Meagan Molina presents for: HTN-takes Carvedilol and Lisinopril-stable-no recent changes  DM-endo, eye specialist 07/2019-labwork with endo and f/u in 07/2019-q 3 months-now well controlled off insulin Cardiology-yearly-Dr. Bronson Ing placed in 2005-plavix and asa daily Hyperlipidemia-reviewed labwork [facility] labwork-stable on simvastatin Hypothyroid-stable on synthroid no recent TSH DEXA 02/2018 Mammogram 2020 No colonoscopy Insomnia uses melatonin  Past Medical History:  Diagnosis Date  . Allergic rhinitis, unspecified   . Anxiety disorder, unspecified   . Atherosclerotic heart disease of native coronary artery without angina pectoris   . Cataract   . Coronary artery disease    cardiac catheterization 03/01/12, 11/14/03, 10/10/2003, 02/21/1999, 08/14/1985  . Diabetes mellitus (HCC)    non-insulin dependent  . Essential (primary) hypertension   . Fibromyalgia   . Fibromyalgia   . Hyperlipidemia, mixed   . Hyperlipidemia, unspecified   . Hypertension   . Hypertensive heart disease without heart failure   . Hypothyroidism   . Hypothyroidism, unspecified   . Major depressive disorder, single episode, moderate (HCC)   . Other chest pain   . Other muscle spasm   . Primary insomnia   . Shortness of breath    2D Echocardiogram 02/03/12 with EF of greater than 55%  . Shortness of breath   . Type 2 diabetes mellitus with hyperglycemia (HCC)   . Type 2 diabetes mellitus with hyperglycemia [facility])     Past Surgical History:  Procedure Laterality Date  . ABDOMINAL HYSTERECTOMY    . BREAST BIOPSY    . BREAST SURGERY    . CARDIAC CATHETERIZATION  11/14/2003, 10/10/2003   2.25x36mm cutting balloon performed in mid left anterior descending,  stenosis reduced from 80% to 0% with TIMI-3 to TIMI-3 flow maintained.   On 10/10/03 percutaneous angioplasty and stenting of proximal circumflex coronary artery with 3.5x58mm Cypher stent, post dilated with 3.5x51mm PowerSail at 16 atmospheric pressure maximum, stenosis reduced  from 99% to 0% with TIMI-2 to 3 flow maintained   . EYE SURGERY    . LEFT HEART CATHETERIZATION WITH CORONARY ANGIOGRAM N/A 03/01/2012   Procedure: LEFT HEART CATHETERIZATION WITH CORONARY ANGIOGRAM;  Surgeon: Runell Gess, MD;  Location[facility] CATH LAB;  Service: Cardiovascular;  Laterality: N/A;  . salpingo-oophorectomy comp/prtl uni/bi spx      Family History  Problem Relation Age of Onset  . Cancer Mother        breast cancer deceased in her 36's  . Coronary artery disease Father        myocardial infarction 09/2003  . Pneumonia Brother     Social History   Socioeconomic History  . Marital status: Widowed    Spouse name: Not on file  . Number of children: Not on file  . Years of education: Not on file  . Highest education level: Not on file  Occupational History  . Not on file  Tobacco Use  . Smoking status: Never Smoker  . Smokeless tobacco: Never Used  Substance and Sexual Activity  . Alcohol use: No  . Drug use: No  . Sexual activity: Not Currently  Other Topics Concern  . Not on file  Social History Narrative  . Not on file   Social Determinants of Health   Financial Resource Strain:   .  Difficulty of Paying Living Expenses: Not on file  Food Insecurity:   . Worried About Programme researcher, broadcasting/film/video in the Last Year: Not on file  . Ran Out of Food in the Last Year: Not on file  Transportation Needs:   . Lack of Transportation (Medical): Not on file  . Lack of Transportation (Non-Medical): Not on file  Physical Activity:   . Days of Exercise per Week: Not on file  . Minutes of Exercise per Session: Not on file  Stress:   . Feeling of Stress : Not on file  Social Connections:   . Frequency of  Communication with Friends and Family: Not on file  . Frequency of Social Gatherings with Friends and Family: Not on file  . Attends Religious Services: Not on file  . Active Member of Clubs or Organizations: Not on file  . Attends Banker Meetings: Not on file  . Marital Status: Not on file  Intimate Partner Violence:   . Fear of Current or Ex-Partner: Not on file  . Emotionally Abused: Not on file  . Physically Abused: Not on file  . Sexually Abused: Not on file    ROS Review of Systems  Constitutional: Positive for fatigue and unexpected weight change.  HENT: Positive for tinnitus.   Eyes: Positive for photophobia and itching.  Respiratory: Positive for shortness of breath.   Cardiovascular: Positive for leg swelling.  Endocrine:       DM/hypothroid-goiter  Genitourinary: Negative.   Musculoskeletal: Positive for arthralgias, back pain and neck stiffness.  Skin:       No recent derm appt  Allergic/Immunologic: Negative.   Neurological: Negative.   Hematological: Negative.   Psychiatric/Behavioral: Positive for dysphoric mood.    Objective:   Today's Vitals: BP 140/85 (BP Location: Left Arm, Patient Position: Sitting, Cuff Size: Normal)   Pulse 71   Temp 98.1 F (36.7 C) (Oral)   Ht 5\' 4"  (1.626 m)   Wt 199 lb 6.4 oz (90.4 kg)   SpO2 99%   BMI 34.23 kg/m   Physical Exam Constitutional:      Appearance: Normal appearance. She is obese.  HENT:     Head: Normocephalic and atraumatic.  Eyes:     Conjunctiva/sclera: Conjunctivae normal.  Cardiovascular:     Rate and Rhythm: Normal rate and regular rhythm.     Pulses: Normal pulses.     Heart sounds: Normal heart sounds.  Pulmonary:     Effort: Pulmonary effort is normal.     Breath sounds: Normal breath sounds.  Musculoskeletal:        General: Normal range of motion.     Cervical back: Normal range of motion and neck supple.  Neurological:     Mental Status: She is alert and oriented to person,  place, and time.  Psychiatric:        Mood and Affect: Mood normal.        Behavior: Behavior normal.     Assessment & Plan:  1. Hyperlipidemia, unspecified hyperlipidemia type Simvastatin-lipid panel shows LDL 81 on 03/2019 labwork-f/u 1 year on lipid panel  2. Hypothyroidism, unspecified type TSH pending-last TSH- stable 9/19  3. Hypertensive heart disease without heart failure Pt with h/o MI-seen by cardiology-taking plavix and asa-no further concerns post stent Outpatient Encounter Medications as of 07/17/2019  Medication Sig  . Melatonin 10 MG CAPS Take 10 mg by mouth every evening.  Marland Kitchen aspirin EC 81 MG tablet Take 81 mg by mouth daily.  Marland Kitchen  carvedilol (COREG) 12.5 MG tablet Take 12.5 mg by mouth 2 (two) times daily with a meal.  . clobetasol (TEMOVATE) 0.05 % external solution Apply 1 application topically 2 (two) times daily as needed. Itchy scalp  . clopidogrel (PLAVIX) 75 MG tablet Take 75 mg by mouth daily.  Marland Kitchen glipiZIDE (GLUCOTROL XL) 5 MG 24 hr tablet Take 1 tablet by mouth at bedtime.  Marland Kitchen levothyroxine (SYNTHROID, LEVOTHROID) 88 MCG tablet Take 88 mcg by mouth daily.  Marland Kitchen lisinopril (PRINIVIL,ZESTRIL) 5 MG tablet Take 5 mg by mouth daily.  . metFORMIN (GLUCOPHAGE-XR) 500 MG 24 hr tablet Take 1 tablet by mouth 2 (two) times daily.  . pioglitazone (ACTOS) 15 MG tablet Take 1 tablet by mouth daily. For diabetes  . simvastatin (ZOCOR) 40 MG tablet Take 40 mg by mouth every evening.   No facility-administered encounter medications on file as of 07/17/2019.    Follow-up: 6 month f/u  Uzma Hellmer Mat Carne, MD

## 2019-07-17 NOTE — Patient Instructions (Addendum)
Have endo draw TSH with next blood work  Call about cologuard when ready to place order for kit  Call derm for full body survey

## 2019-08-03 DIAGNOSIS — I10 Essential (primary) hypertension: Secondary | ICD-10-CM | POA: Diagnosis not present

## 2019-08-03 DIAGNOSIS — E538 Deficiency of other specified B group vitamins: Secondary | ICD-10-CM | POA: Diagnosis not present

## 2019-08-03 DIAGNOSIS — I2510 Atherosclerotic heart disease of native coronary artery without angina pectoris: Secondary | ICD-10-CM | POA: Diagnosis not present

## 2019-08-03 DIAGNOSIS — Z79899 Other long term (current) drug therapy: Secondary | ICD-10-CM | POA: Diagnosis not present

## 2019-08-03 DIAGNOSIS — Z794 Long term (current) use of insulin: Secondary | ICD-10-CM | POA: Diagnosis not present

## 2019-08-03 DIAGNOSIS — E039 Hypothyroidism, unspecified: Secondary | ICD-10-CM | POA: Diagnosis not present

## 2019-08-03 DIAGNOSIS — E1165 Type 2 diabetes mellitus with hyperglycemia: Secondary | ICD-10-CM | POA: Diagnosis not present

## 2019-08-03 DIAGNOSIS — E782 Mixed hyperlipidemia: Secondary | ICD-10-CM | POA: Diagnosis not present

## 2019-08-04 LAB — TSH: TSH: 1.94 mIU/L (ref 0.40–4.50)

## 2019-08-15 ENCOUNTER — Ambulatory Visit: Payer: Medicare Other | Admitting: Family Medicine

## 2019-08-23 ENCOUNTER — Ambulatory Visit: Payer: Medicare Other | Admitting: Family Medicine

## 2019-09-25 ENCOUNTER — Other Ambulatory Visit: Payer: Self-pay

## 2019-09-25 ENCOUNTER — Encounter: Payer: Self-pay | Admitting: Family Medicine

## 2019-09-25 DIAGNOSIS — E785 Hyperlipidemia, unspecified: Secondary | ICD-10-CM

## 2019-09-25 DIAGNOSIS — I119 Hypertensive heart disease without heart failure: Secondary | ICD-10-CM

## 2019-09-25 MED ORDER — CLOPIDOGREL BISULFATE 75 MG PO TABS: 75.0000 mg | ORAL_TABLET | Freq: Every day | ORAL | 1 refills | Status: DC

## 2019-09-25 MED ORDER — SIMVASTATIN 40 MG PO TABS: 40.0000 mg | ORAL_TABLET | Freq: Every evening | ORAL | 1 refills | Status: DC

## 2019-09-25 MED ORDER — LISINOPRIL 5 MG PO TABS: 5.0000 mg | ORAL_TABLET | Freq: Every day | ORAL | 1 refills | Status: DC

## 2019-10-30 DIAGNOSIS — E538 Deficiency of other specified B group vitamins: Secondary | ICD-10-CM | POA: Diagnosis not present

## 2019-10-30 DIAGNOSIS — Z794 Long term (current) use of insulin: Secondary | ICD-10-CM | POA: Diagnosis not present

## 2019-10-30 DIAGNOSIS — E782 Mixed hyperlipidemia: Secondary | ICD-10-CM | POA: Diagnosis not present

## 2019-10-30 DIAGNOSIS — E1165 Type 2 diabetes mellitus with hyperglycemia: Secondary | ICD-10-CM | POA: Diagnosis not present

## 2019-11-01 DIAGNOSIS — E1165 Type 2 diabetes mellitus with hyperglycemia: Secondary | ICD-10-CM | POA: Diagnosis not present

## 2019-11-01 DIAGNOSIS — E782 Mixed hyperlipidemia: Secondary | ICD-10-CM | POA: Diagnosis not present

## 2019-11-01 DIAGNOSIS — E538 Deficiency of other specified B group vitamins: Secondary | ICD-10-CM | POA: Diagnosis not present

## 2019-11-01 DIAGNOSIS — E039 Hypothyroidism, unspecified: Secondary | ICD-10-CM | POA: Diagnosis not present

## 2019-11-01 DIAGNOSIS — I2510 Atherosclerotic heart disease of native coronary artery without angina pectoris: Secondary | ICD-10-CM | POA: Diagnosis not present

## 2019-11-01 DIAGNOSIS — Z7984 Long term (current) use of oral hypoglycemic drugs: Secondary | ICD-10-CM | POA: Diagnosis not present

## 2019-11-01 DIAGNOSIS — I10 Essential (primary) hypertension: Secondary | ICD-10-CM | POA: Diagnosis not present

## 2019-11-01 DIAGNOSIS — E559 Vitamin D deficiency, unspecified: Secondary | ICD-10-CM | POA: Diagnosis not present

## 2019-12-05 ENCOUNTER — Encounter (INDEPENDENT_AMBULATORY_CARE_PROVIDER_SITE_OTHER): Payer: Self-pay | Admitting: Internal Medicine

## 2019-12-05 ENCOUNTER — Ambulatory Visit (INDEPENDENT_AMBULATORY_CARE_PROVIDER_SITE_OTHER): Payer: Medicare Other | Admitting: Internal Medicine

## 2019-12-05 ENCOUNTER — Other Ambulatory Visit: Payer: Self-pay

## 2019-12-05 VITALS — BP 130/80 | HR 88 | Temp 97.5°F | Resp 18 | Ht 64.0 in | Wt 194.0 lb

## 2019-12-05 DIAGNOSIS — E039 Hypothyroidism, unspecified: Secondary | ICD-10-CM | POA: Diagnosis not present

## 2019-12-05 DIAGNOSIS — I10 Essential (primary) hypertension: Secondary | ICD-10-CM | POA: Diagnosis not present

## 2019-12-05 DIAGNOSIS — E782 Mixed hyperlipidemia: Secondary | ICD-10-CM

## 2019-12-05 DIAGNOSIS — E1165 Type 2 diabetes mellitus with hyperglycemia: Secondary | ICD-10-CM

## 2019-12-05 DIAGNOSIS — E669 Obesity, unspecified: Secondary | ICD-10-CM

## 2019-12-05 NOTE — Progress Notes (Signed)
Metrics: Intervention Frequency ACO  Documented Smoking Status Yearly  Screened one or more times in 24 months  Cessation Counseling or  Active cessation medication Past 24 months  Past 24 months   Guideline developer: UpToDate (See UpToDate for funding source) Date Released: 2014       Wellness Office Visit  Subjective:  Patient ID: Meagan Molina, female    DOB: Mar 10, 1951  Age: 69 y.o. MRN: [id number]  CC: This 69 year old lady comes to our practice to establish care.  Her primary care physician is leaving the practice.  He used to be a patient of Dr. Juanetta Gosling. HPI She has longstanding type 2 diabetes and she tells me her last hemoglobin A1c was fantastic at 5%.  However, she continues to take glipizide. She does have a history of coronary artery disease and continues with statin therapy. She also has a history of hypertension and takes medications for this. She also has a history of hypothyroidism for which she takes levothyroxine. She does see an endocrinologist.  Past Medical History:  Diagnosis Date  . Allergic rhinitis, unspecified   . Anxiety disorder, unspecified   . Atherosclerotic heart disease of native coronary artery without angina pectoris   . Cataract   . Coronary artery disease    cardiac catheterization 03/01/12, 11/14/03, 10/10/2003, 02/21/1999, 08/14/1985  . Diabetes mellitus (HCC)    non-insulin dependent  . Essential (primary) hypertension   . Fibromyalgia   . Fibromyalgia   . Hyperlipidemia, mixed   . Hyperlipidemia, unspecified   . Hypertension   . Hypertensive heart disease without heart failure   . Hypothyroidism   . Hypothyroidism, unspecified   . Major depressive disorder, single episode, moderate (HCC)   . Other chest pain   . Other muscle spasm   . Primary insomnia   . Shortness of breath    2D Echocardiogram 02/03/12 with EF of greater than 55%  . Shortness of breath   . Type 2 diabetes mellitus with hyperglycemia (HCC)   . Type 2 diabetes  mellitus with hyperglycemia (HCC)       Family History  Problem Relation Age of Onset  . Cancer Mother        breast cancer deceased in her 37's  . Coronary artery disease Father        myocardial infarction 09/2003  . Pneumonia Brother     Social History   Social History Narrative  . Not on file   Social History   Tobacco Use  . Smoking status: Never Smoker  . Smokeless tobacco: Never Used  Substance Use Topics  . Alcohol use: No    Current Meds  Medication Sig  . ACCU-CHEK AVIVA PLUS test strip 1 each daily.  Marland Kitchen aspirin EC 81 MG tablet Take 81 mg by mouth daily.  . carvedilol (COREG) 12.5 MG tablet Take 12.5 mg by mouth 2 (two) times daily with a meal.  . clobetasol (TEMOVATE) 0.05 % external solution Apply 1 application topically 2 (two) times daily as needed. Itchy scalp  . clopidogrel (PLAVIX) 75 MG tablet Take 1 tablet (75 mg total) by mouth daily.  Marland Kitchen glipiZIDE (GLUCOTROL XL) 5 MG 24 hr tablet Take 1 tablet by mouth at bedtime.  Marland Kitchen glucose blood (ACCU-CHEK AVIVA PLUS) test strip Use to test blood sugar once a day (Dx:  E11.65)  . levothyroxine (SYNTHROID, LEVOTHROID) 88 MCG tablet Take 88 mcg by mouth daily.  Marland Kitchen lisinopril (ZESTRIL) 5 MG tablet Take 1 tablet (5 mg total) by mouth  daily.  . Melatonin 10 MG CAPS Take 10 mg by mouth every evening.  . metFORMIN (GLUCOPHAGE-XR) 500 MG 24 hr tablet Take 1 tablet by mouth 2 (two) times daily.  . pioglitazone (ACTOS) 15 MG tablet Take 1 tablet by mouth daily. For diabetes  . simvastatin (ZOCOR) 40 MG tablet Take 1 tablet (40 mg total) by mouth every evening.  . [DISCONTINUED] metFORMIN (GLUCOPHAGE-XR) 500 MG 24 hr tablet Take 1 tablet with food twice a day for diabetes      Objective:   Today's Vitals: BP 130/80 (BP Location: Right Arm, Patient Position: Sitting, Cuff Size: Normal)   Pulse 88   Temp (!) 97.5 F (36.4 C) (Temporal)   Resp 18   Ht 5\' 4"  (1.626 m)   Wt 194 lb (88 kg)   SpO2 98%   BMI 33.30 kg/m   Vitals with BMI 12/05/2019 07/17/2019 07/07/2019  Height 5\' 4"  5\' 4"  5\' 4"   Weight 194 lbs 199 lbs 6 oz 198 lbs  BMI 33.28 34.21 33.97  Systolic 130 140 161  Diastolic 80 85 79  Pulse 88 71 77     Physical Exam   She looks systemically well.  She is obese.  Blood pressure reasonable.  Alert and orientated without any focal neurological signs    Assessment   1. Type 2 diabetes mellitus with hyperglycemia, without long-term current use of insulin (HCC)   2. Essential (primary) hypertension   3. Acquired hypothyroidism   4. Mixed hyperlipidemia   5. Obesity (BMI 30-39.9)       Tests ordered No orders of the defined types were placed in this encounter.    Plan: 1. She will continue with all medications for chronic conditions at the present time except I requested that she discontinue glipizide as it increases insulin levels.  I explained this to the patient.  She will continue to monitor blood glucose level but I will see her back for follow-up in just 2 weeks time we will have the more lengthier discussion regarding nutrition. 2. Today I spent 30 minutes with this new patient, reviewing her medical records and discussing her chronic conditions.   No orders of the defined types were placed in this encounter.   Wilson Singer, MD

## 2019-12-19 ENCOUNTER — Other Ambulatory Visit: Payer: Self-pay

## 2019-12-19 ENCOUNTER — Ambulatory Visit (INDEPENDENT_AMBULATORY_CARE_PROVIDER_SITE_OTHER): Payer: Medicare Other | Admitting: Internal Medicine

## 2019-12-19 ENCOUNTER — Encounter (INDEPENDENT_AMBULATORY_CARE_PROVIDER_SITE_OTHER): Payer: Self-pay | Admitting: Internal Medicine

## 2019-12-19 VITALS — BP 136/77 | HR 77 | Temp 97.9°F | Resp 18 | Ht 65.0 in | Wt 196.4 lb

## 2019-12-19 DIAGNOSIS — E039 Hypothyroidism, unspecified: Secondary | ICD-10-CM

## 2019-12-19 DIAGNOSIS — E1165 Type 2 diabetes mellitus with hyperglycemia: Secondary | ICD-10-CM

## 2019-12-19 DIAGNOSIS — E669 Obesity, unspecified: Secondary | ICD-10-CM | POA: Diagnosis not present

## 2019-12-19 NOTE — Patient Instructions (Signed)
[facility] Dietary Recommendations for Weight Loss What to Avoid . Avoid added sugars o Often added sugar can be found in processed foods such as many condiments, dry cereals, cakes, cookies, chips, crisps, crackers, candies, sweetened drinks, etc.  o Read labels and AVOID/DECREASE use of foods with the following in their ingredient list: Sugar, fructose, high fructose corn syrup, sucrose, glucose, maltose, dextrose, molasses, cane sugar, brown sugar, any type of syrup, agave nectar, etc.   . Avoid snacking in between meals . Avoid foods made with flour o If you are going to eat food made with flour, choose those made with whole-grains; and, minimize your consumption as much as is tolerable . Avoid processed foods o These foods are generally stocked in the middle of the grocery store. Focus on shopping on the perimeter of the grocery.  . Avoid Meat  o We recommend following a plant-based diet at [facility]. Thus, we recommend avoiding meat as a general rule. Consider eating beans, legumes, eggs, and/or dairy products for regular protein sources o If you plan on eating meat limit to 4 ounces of meat at a time and choose lean options such as Fish, chicken, turkey. Avoid red meat intake such as pork and/or steak What to Include . Vegetables o GREEN LEAFY VEGETABLES: Kale, spinach, mustard greens, collard greens, cabbage, broccoli, etc. o OTHER: Asparagus, cauliflower, eggplant, carrots, peas, Brussel sprouts, tomatoes, bell peppers, zucchini, beets, cucumbers, etc. . Grains, seeds, and legumes o Beans: kidney beans, black eyed peas, garbanzo beans, black beans, pinto beans, etc. o Whole, unrefined grains: brown rice, barley, bulgur, oatmeal, etc. . Healthy fats  o Avoid highly processed fats such as vegetable oil o Examples of healthy fats: avocado, olives, virgin olive oil, dark chocolate (?72% Cocoa), nuts (peanuts, almonds, walnuts, cashews, pecans, etc.) . None to Low  Intake of Animal Sources of Protein o Meat sources: chicken, turkey, salmon, tuna. Limit to 4 ounces of meat at one time. o Consider limiting dairy sources, but when choosing dairy focus on: PLAIN Greek yogurt, cottage cheese, high-protein milk . Fruit o Choose berries  When to Eat . Intermittent Fasting: o Choosing not to eat for a specific time period, but DO FOCUS ON HYDRATION when fasting o Multiple Techniques: - Time Restricted Eating: eat 3 meals in a day, each meal lasting no more than 60 minutes, no snacks between meals - 16-18 hour fast: fast for 16 to 18 hours up to 7 days a week. Often suggested to start with 2-3 nonconsecutive days per week.  . Remember the time you sleep is counted as fasting.  . Examples of eating schedule: Fast from 7:00pm-11:00am. Eat between 11:00am-7:00pm.  - 24-hour fast: fast for 24 hours up to every other day. Often suggested to start with 1 day per week . Remember the time you sleep is counted as fasting . Examples of eating schedule:  o Eating day: eat 2-3 meals on your eating day. If doing 2 meals, each meal should last no more than 90 minutes. If doing 3 meals, each meal should last no more than 60 minutes. Finish last meal by 7:00pm. o Fasting day: Fast until 7:00pm.  o IF YOU FEEL UNWELL FOR ANY REASON/IN ANY WAY WHEN FASTING, STOP FASTING BY EATING A NUTRITIOUS SNACK OR LIGHT MEAL o ALWAYS FOCUS ON HYDRATION DURING FASTS - Acceptable Hydration sources: water, broths, tea/coffee (black tea/coffee is best but using a small amount of whole-fat dairy products in coffee/tea is acceptable).  -   Poor Hydration Sources: anything with sugar or artificial sweeteners added to it  These recommendations have been developed for patients that are actively receiving medical care from either Dr. Lavanda Nevels or Sarah Gray, DNP, NP-C at [facility]. These recommendations are developed for patients with specific medical conditions and are not meant to be  distributed or used by others that are not actively receiving care from either provider listed above at [facility]. It is not appropriate to participate in the above eating plans without proper medical supervision.   Reference: Fung, J. The obesity code. [place]: [place]; 2016.   

## 2019-12-19 NOTE — Progress Notes (Signed)
Metrics: Intervention Frequency ACO  Documented Smoking Status Yearly  Screened one or more times in 24 months  Cessation Counseling or  Active cessation medication Past 24 months  Past 24 months   Guideline developer: UpToDate (See UpToDate for funding source) Date Released: 2014       Wellness Office Visit  Subjective:  Patient ID: Meagan Molina, female    DOB: 01-05-1951  Age: 69 y.o. MRN: [id number]  CC: This lady comes in to discuss her diabetes, hypothyroidism and her symptoms which are described below. HPI She feels constantly tired.  She also describes hot flashes at night. Her hemoglobin A1c most recently done was 5%.  I told her to discontinue glipizide which she has done. She is going to follow-up with me regarding her diabetes and thyroid from now rather than the endocrinologist.  Past Medical History:  Diagnosis Date  . Allergic rhinitis, unspecified   . Anxiety disorder, unspecified   . Atherosclerotic heart disease of native coronary artery without angina pectoris   . Cataract   . Coronary artery disease    cardiac catheterization 03/01/12, 11/14/03, 10/10/2003, 02/21/1999, 08/14/1985  . Diabetes mellitus (HCC)    non-insulin dependent  . Essential (primary) hypertension   . Fibromyalgia   . Fibromyalgia   . Hyperlipidemia, mixed   . Hyperlipidemia, unspecified   . Hypertension   . Hypertensive heart disease without heart failure   . Hypothyroidism   . Hypothyroidism, unspecified   . Major depressive disorder, single episode, moderate (HCC)   . Other chest pain   . Other muscle spasm   . Primary insomnia   . Shortness of breath    2D Echocardiogram 02/03/12 with EF of greater than 55%  . Shortness of breath   . Type 2 diabetes mellitus with hyperglycemia (HCC)   . Type 2 diabetes mellitus with hyperglycemia [facility])    Past Surgical History:  Procedure Laterality Date  . ABDOMINAL HYSTERECTOMY    . BREAST BIOPSY    . BREAST SURGERY    . CARDIAC  CATHETERIZATION  11/14/2003, 10/10/2003   2.25x34mm cutting balloon performed in mid left anterior descending, stenosis reduced from 80% to 0% with TIMI-3 to TIMI-3 flow maintained.   On 10/10/03 percutaneous angioplasty and stenting of proximal circumflex coronary artery with 3.5x19mm Cypher stent, post dilated with 3.5x47mm PowerSail at 16 atmospheric pressure maximum, stenosis reduced  from 99% to 0% with TIMI-2 to 3 flow maintained   . EYE SURGERY    . LEFT HEART CATHETERIZATION WITH CORONARY ANGIOGRAM N/A 03/01/2012   Procedure: LEFT HEART CATHETERIZATION WITH CORONARY ANGIOGRAM;  Surgeon: Runell Gess, MD;  Location: [facility] CATH LAB;  Service: Cardiovascular;  Laterality: N/A;  . salpingo-oophorectomy comp/prtl uni/bi spx       Family History  Problem Relation Age of Onset  . Cancer Mother        breast cancer deceased in her 51's  . Coronary artery disease Father        myocardial infarction 09/2003  . Pneumonia Brother     Social History   Social History Narrative   Widow since last 15 years.Lives alone.   Social History   Tobacco Use  . Smoking status: Never Smoker  . Smokeless tobacco: Never Used  Substance Use Topics  . Alcohol use: No    Current Meds  Medication Sig  . ACCU-CHEK AVIVA PLUS test strip 1 each daily.  Marland Kitchen aspirin EC 81 MG tablet Take 81 mg by mouth daily.  . carvedilol (  COREG) 12.5 MG tablet Take 12.5 mg by mouth 2 (two) times daily with a meal.  . clobetasol (TEMOVATE) 0.05 % external solution Apply 1 application topically 2 (two) times daily as needed. Itchy scalp  . clopidogrel (PLAVIX) 75 MG tablet Take 1 tablet (75 mg total) by mouth daily.  Marland Kitchen glucose blood (ACCU-CHEK AVIVA PLUS) test strip Use to test blood sugar once a day (Dx:  E11.65)  . levothyroxine (SYNTHROID, LEVOTHROID) 88 MCG tablet Take 88 mcg by mouth daily.  Marland Kitchen lisinopril (ZESTRIL) 5 MG tablet Take 1 tablet (5 mg total) by mouth daily.  . Melatonin 10 MG CAPS Take 10 mg by mouth every  evening.  . metFORMIN (GLUCOPHAGE-XR) 500 MG 24 hr tablet Take 1 tablet by mouth 2 (two) times daily.  . pioglitazone (ACTOS) 15 MG tablet Take 1 tablet by mouth daily. For diabetes  . simvastatin (ZOCOR) 40 MG tablet Take 1 tablet (40 mg total) by mouth every evening.       Depression screen [facility] 2/9 07/17/2019  Decreased Interest 0  Down, Depressed, Hopeless 0  PHQ - 2 Score 0     Objective:   Today's Vitals: BP 136/77 (BP Location: Right Arm, Patient Position: Sitting, Cuff Size: Normal)   Pulse 77   Temp 97.9 F (36.6 C) (Temporal)   Resp 18   Ht 5\' 5"  (1.651 m)   Wt 196 lb 6.4 oz (89.1 kg)   SpO2 98%   BMI 32.68 kg/m  Vitals with BMI 12/19/2019 12/05/2019 07/17/2019  Height 5\' 5"  5\' 4"  5\' 4"   Weight 196 lbs 6 oz 194 lbs 199 lbs 6 oz  BMI 32.68 33.28 34.21  Systolic 136 130 528  Diastolic 77 80 85  Pulse 77 88 71     Physical Exam  She looks systemically well.  She remains obese.  Blood pressure reasonable.     Assessment   1. Acquired hypothyroidism   2. Type 2 diabetes mellitus with hyperglycemia, without long-term current use of insulin (HCC)   3. Obesity (BMI 30-39.9)       Tests ordered Orders Placed This Encounter  Procedures  . T3, free  . T4  . TSH     Plan: 1.  Blood was drawn for thyroid panel.  I am going to switch her from levothyroxine to NP thyroid 60 mg tablets, take 1 daily.  I have given her samples today.  I explained the role of T3 and thyroid function and how important it is.  I explained possible side effects with starting the new medication also. 2.  We also discussed nutrition at length.  She already does intermittent fasting for about 18 hours every day without knowing it.  I encouraged her to continue doing this, maintaining hydration and we also discussed more of a plant-based diet. 3.  I will see her in about a month's time for close follow-up. Today we spent 40 minutes together discussing nutrition and thyroid  function.   No orders of the defined types were placed in this encounter.   Wilson Singer, MD

## 2019-12-20 LAB — T4: T4, Total: 9.1 ug/dL (ref 5.1–11.9)

## 2019-12-20 LAB — TSH: TSH: 5.05 mIU/L — ABNORMAL HIGH (ref 0.40–4.50)

## 2019-12-20 LAB — T3, FREE: T3, Free: 2.8 pg/mL (ref 2.3–4.2)

## 2020-01-16 ENCOUNTER — Ambulatory Visit: Payer: Medicare Other | Admitting: Family Medicine

## 2020-01-24 ENCOUNTER — Ambulatory Visit (INDEPENDENT_AMBULATORY_CARE_PROVIDER_SITE_OTHER): Payer: Medicare Other | Admitting: Internal Medicine

## 2020-01-24 ENCOUNTER — Encounter (INDEPENDENT_AMBULATORY_CARE_PROVIDER_SITE_OTHER): Payer: Self-pay | Admitting: Internal Medicine

## 2020-01-24 ENCOUNTER — Other Ambulatory Visit: Payer: Self-pay

## 2020-01-24 VITALS — BP 150/90 | HR 78 | Temp 97.5°F | Ht 65.0 in | Wt 191.8 lb

## 2020-01-24 DIAGNOSIS — E2839 Other primary ovarian failure: Secondary | ICD-10-CM | POA: Diagnosis not present

## 2020-01-24 DIAGNOSIS — I10 Essential (primary) hypertension: Secondary | ICD-10-CM | POA: Diagnosis not present

## 2020-01-24 DIAGNOSIS — E782 Mixed hyperlipidemia: Secondary | ICD-10-CM | POA: Diagnosis not present

## 2020-01-24 DIAGNOSIS — E1165 Type 2 diabetes mellitus with hyperglycemia: Secondary | ICD-10-CM | POA: Diagnosis not present

## 2020-01-24 DIAGNOSIS — E039 Hypothyroidism, unspecified: Secondary | ICD-10-CM | POA: Diagnosis not present

## 2020-01-24 MED ORDER — PROGESTERONE 200 MG PO CAPS
200.0000 mg | ORAL_CAPSULE | Freq: Every day | ORAL | 3 refills | Status: DC
Start: 2020-01-24 — End: 2020-01-25

## 2020-01-24 MED ORDER — ESTRADIOL 0.5 MG PO TABS
0.5000 mg | ORAL_TABLET | Freq: Every day | ORAL | 3 refills | Status: DC
Start: 2020-01-24 — End: 2020-04-30

## 2020-01-24 NOTE — Progress Notes (Signed)
Metrics: Intervention Frequency ACO  Documented Smoking Status Yearly  Screened one or more times in 24 months  Cessation Counseling or  Active cessation medication Past 24 months  Past 24 months   Guideline developer: UpToDate (See UpToDate for funding source) Date Released: 2014       Wellness Office Visit  Subjective:  Patient ID: Meagan Molina, female    DOB: Oct 28, 1950  Age: 69 y.o. MRN: [id number]  CC: This lady comes in for follow-up of diabetes, hypertension, hypothyroidism, hyperlipidemia. HPI  On the last visit, I changed her levothyroxine to NP thyroid 60 mg daily.  She has tolerated this without any adverse effects. Today she is complaining of vagina dryness.  She has had this for a long time.  In fact her previous relationship was discontinued because of this problem apparently. She continues on glipizide and Actos for her diabetes.  She is not able to tolerate Metformin so she discontinued this and continue with the glipizide. She continues on simvastatin for hyperlipidemia in the face of diabetes. She continues on lisinopril and carvedilol for hypertension. She denies any chest pain, dyspnea, palpitations or limb weakness. Past Medical History:  Diagnosis Date  . Allergic rhinitis, unspecified   . Anxiety disorder, unspecified   . Atherosclerotic heart disease of native coronary artery without angina pectoris   . Cataract   . Coronary artery disease    cardiac catheterization 03/01/12, 11/14/03, 10/10/2003, 02/21/1999, 08/14/1985  . Diabetes mellitus (HCC)    non-insulin dependent  . Essential (primary) hypertension   . Fibromyalgia   . Fibromyalgia   . Hyperlipidemia, mixed   . Hyperlipidemia, unspecified   . Hypertension   . Hypertensive heart disease without heart failure   . Hypothyroidism   . Hypothyroidism, unspecified   . Major depressive disorder, single episode, moderate (HCC)   . Other chest pain   . Other muscle spasm   . Primary insomnia   .  Shortness of breath    2D Echocardiogram 02/03/12 with EF of greater than 55%  . Shortness of breath   . Type 2 diabetes mellitus with hyperglycemia (HCC)   . Type 2 diabetes mellitus with hyperglycemia [facility])    Past Surgical History:  Procedure Laterality Date  . ABDOMINAL HYSTERECTOMY     TAH&BSO Benign mass 1987  . BREAST BIOPSY    . BREAST SURGERY    . CARDIAC CATHETERIZATION  11/14/2003, 10/10/2003   2.25x47mm cutting balloon performed in mid left anterior descending, stenosis reduced from 80% to 0% with TIMI-3 to TIMI-3 flow maintained.   On 10/10/03 percutaneous angioplasty and stenting of proximal circumflex coronary artery with 3.5x74mm Cypher stent, post dilated with 3.5x75mm PowerSail at 16 atmospheric pressure maximum, stenosis reduced  from 99% to 0% with TIMI-2 to 3 flow maintained   . EYE SURGERY    . LEFT HEART CATHETERIZATION WITH CORONARY ANGIOGRAM N/A 03/01/2012   Procedure: LEFT HEART CATHETERIZATION WITH CORONARY ANGIOGRAM;  Surgeon: Runell Gess, MD;  Location: [facility] CATH LAB;  Service: Cardiovascular;  Laterality: N/A;  . salpingo-oophorectomy comp/prtl uni/bi spx       Family History  Problem Relation Age of Onset  . Cancer Mother        breast cancer deceased in her 66's  . Coronary artery disease Father        myocardial infarction 09/2003  . Pneumonia Brother     Social History   Social History Narrative   Widow since last 15 years.Lives alone.   Social History  Tobacco Use  . Smoking status: Never Smoker  . Smokeless tobacco: Never Used  Substance Use Topics  . Alcohol use: No    Current Meds  Medication Sig  . ACCU-CHEK AVIVA PLUS test strip 1 each daily.  Marland Kitchen aspirin EC 81 MG tablet Take 81 mg by mouth daily.  . carvedilol (COREG) 12.5 MG tablet Take 12.5 mg by mouth 2 (two) times daily with a meal.  . clobetasol (TEMOVATE) 0.05 % external solution Apply 1 application topically 2 (two) times daily as needed. Itchy scalp  . clopidogrel  (PLAVIX) 75 MG tablet Take 1 tablet (75 mg total) by mouth daily.  Marland Kitchen glipiZIDE (GLUCOTROL XL) 5 MG 24 hr tablet Take 5 mg by mouth at bedtime.  Marland Kitchen glucose blood (ACCU-CHEK AVIVA PLUS) test strip Use to test blood sugar once a day (Dx:  E11.65)  . lisinopril (ZESTRIL) 5 MG tablet Take 1 tablet (5 mg total) by mouth daily.  . pioglitazone (ACTOS) 15 MG tablet Take 1 tablet by mouth daily. For diabetes  . simvastatin (ZOCOR) 40 MG tablet Take 1 tablet (40 mg total) by mouth every evening.  . thyroid (NP THYROID) 60 MG tablet Take 60 mg by mouth daily before breakfast.  . [DISCONTINUED] thyroid (ARMOUR) 60 MG tablet Take 60 mg by mouth daily before breakfast.      Depression screen [facility] 2/9 07/17/2019  Decreased Interest 0  Down, Depressed, Hopeless 0  PHQ - 2 Score 0     Objective:   Today's Vitals: BP (!) 150/90 (BP Location: Left Arm, Patient Position: Sitting, Cuff Size: Normal)   Pulse 78   Temp (!) 97.5 F (36.4 C) (Temporal)   Ht 5\' 5"  (1.651 m)   Wt 191 lb 12.8 oz (87 kg)   SpO2 96%   BMI 31.92 kg/m  Vitals with BMI 01/24/2020 12/19/2019 12/05/2019  Height 5\' 5"  5\' 5"  5\' 4"   Weight 191 lbs 13 oz 196 lbs 6 oz 194 lbs  BMI 31.92 32.68 33.28  Systolic 150 136 409  Diastolic 90 77 80  Pulse 78 77 88     Physical Exam  She looks systemically well.  She has lost 5 pounds since the last time I saw her.  She is eating better and doing intermittent fasting.     Assessment   1. Acquired hypothyroidism   2. Type 2 diabetes mellitus with hyperglycemia, without long-term current use of insulin (HCC)   3. Essential (primary) hypertension   4. Mixed hyperlipidemia   5. Primary ovarian failure       Tests ordered Orders Placed This Encounter  Procedures  . COMPLETE METABOLIC PANEL WITH GFR  . Hemoglobin A1c  . Lipid panel  . T3, free  . TSH  . Estradiol  . Progesterone  . Testos,Total,Free and SHBG (Female)     Plan: 1.  She will continue with NP thyroid 60 mg  daily and we will check thyroid levels today and see if we need to optimize further. 2.  She will continue with glipizide and Actos for diabetes and I will check her A1c today. 3.  She will continue with her previous antihypertension medications and today her blood pressure was slightly elevated.  We will continue to monitor this closely. 4.  She will continue with statin therapy for hyperlipidemia we will check lipid panel today. 5.  For her hot flashes and postmenopausal symptoms, I am going to start her on estradiol and progesterone orally.  We may need some  local cream also to be applied to help her vagina dryness.  I will check estradiol, progesterone and testosterone levels also. 6.  Follow-up in about a month's time to reevaluate.   Meds ordered this encounter  Medications  . estradiol (ESTRACE) 0.5 MG tablet    Sig: Take 1 tablet (0.5 mg total) by mouth daily.    Dispense:  30 tablet    Refill:  3  . progesterone (PROMETRIUM) 200 MG capsule    Sig: Take 1 capsule (200 mg total) by mouth daily.    Dispense:  30 capsule    Refill:  3    Kayliah Tindol Normajean Glasgow, MD

## 2020-01-25 ENCOUNTER — Other Ambulatory Visit (INDEPENDENT_AMBULATORY_CARE_PROVIDER_SITE_OTHER): Payer: Self-pay | Admitting: Nurse Practitioner

## 2020-01-25 MED ORDER — PROGESTERONE 200 MG PO CAPS: 200.0000 mg | ORAL_CAPSULE | Freq: Every day | ORAL | 2 refills | Status: DC

## 2020-01-27 LAB — COMPLETE METABOLIC PANEL WITH GFR
AG Ratio: 1.7 (calc) (ref 1.0–2.5)
ALT: 15 U/L (ref 6–29)
AST: 17 U/L (ref 10–35)
Albumin: 4.3 g/dL (ref 3.6–5.1)
Alkaline phosphatase (APISO): 85 U/L (ref 37–153)
BUN/Creatinine Ratio: 20 (calc) (ref 6–22)
BUN: 20 mg/dL (ref 7–25)
CO2: 28 mmol/L (ref 20–32)
Calcium: 10.1 mg/dL (ref 8.6–10.4)
Chloride: 107 mmol/L (ref 98–110)
Creat: 1.02 mg/dL — ABNORMAL HIGH (ref 0.60–0.93)
GFR, Est African American: 65 mL/min/{1.73_m2} (ref >=60)
GFR, Est Non African American: 56 mL/min/{1.73_m2} — ABNORMAL LOW (ref >=60)
Globulin: 2.6 g/dL (calc) (ref 1.9–3.7)
Glucose, Bld: 72 mg/dL (ref 65–99)
Potassium: 4.5 mmol/L (ref 3.5–5.3)
Sodium: 142 mmol/L (ref 135–146)
Total Bilirubin: 0.6 mg/dL (ref 0.2–1.2)
Total Protein: 6.9 g/dL (ref 6.1–8.1)

## 2020-01-27 LAB — TSH: TSH: 1.79 mIU/L (ref 0.40–4.50)

## 2020-01-27 LAB — TESTOS,TOTAL,FREE AND SHBG (FEMALE)
Free Testosterone: 1.1 pg/mL (ref 0.2–3.7)
Sex Hormone Binding: 113 nmol/L — ABNORMAL HIGH (ref 14–73)
Testosterone, Total, LC-MS-MS: 22 ng/dL (ref 2–45)

## 2020-01-27 LAB — LIPID PANEL
Cholesterol: 147 mg/dL (ref <200)
HDL: 44 mg/dL — ABNORMAL LOW (ref >=50)
LDL Cholesterol (Calc): 75 mg/dL (calc)
Non-HDL Cholesterol (Calc): 103 mg/dL (calc) (ref <130)
Total CHOL/HDL Ratio: 3.3 (calc) (ref <5.0)
Triglycerides: 190 mg/dL — ABNORMAL HIGH (ref <150)

## 2020-01-27 LAB — ESTRADIOL: Estradiol: <15 pg/mL

## 2020-01-27 LAB — HEMOGLOBIN A1C
Hgb A1c MFr Bld: 5 % of total Hgb (ref <5.7)
Mean Plasma Glucose: 97 (calc)
eAG (mmol/L): 5.4 (calc)

## 2020-01-27 LAB — PROGESTERONE: Progesterone: <0.5 ng/mL

## 2020-01-27 LAB — T3, FREE: T3, Free: 3.8 pg/mL (ref 2.3–4.2)

## 2020-02-07 ENCOUNTER — Other Ambulatory Visit (INDEPENDENT_AMBULATORY_CARE_PROVIDER_SITE_OTHER): Payer: Self-pay | Admitting: Internal Medicine

## 2020-02-07 ENCOUNTER — Telehealth (INDEPENDENT_AMBULATORY_CARE_PROVIDER_SITE_OTHER): Payer: Self-pay | Admitting: Internal Medicine

## 2020-02-07 MED ORDER — THYROID 60 MG PO TABS: 60.0000 mg | ORAL_TABLET | Freq: Every day | ORAL | 3 refills | Status: DC

## 2020-02-07 NOTE — Telephone Encounter (Signed)
Okay, I have just sent the prescription now to [facility].

## 2020-02-15 ENCOUNTER — Other Ambulatory Visit: Payer: Self-pay

## 2020-02-15 ENCOUNTER — Ambulatory Visit (INDEPENDENT_AMBULATORY_CARE_PROVIDER_SITE_OTHER): Payer: Medicare Other | Admitting: Internal Medicine

## 2020-02-15 ENCOUNTER — Encounter (INDEPENDENT_AMBULATORY_CARE_PROVIDER_SITE_OTHER): Payer: Self-pay | Admitting: Internal Medicine

## 2020-02-15 VITALS — BP 135/80 | HR 64 | Temp 99.7°F | Ht 65.0 in | Wt 192.8 lb

## 2020-02-15 DIAGNOSIS — I10 Essential (primary) hypertension: Secondary | ICD-10-CM

## 2020-02-15 DIAGNOSIS — E2839 Other primary ovarian failure: Secondary | ICD-10-CM | POA: Diagnosis not present

## 2020-02-15 DIAGNOSIS — E039 Hypothyroidism, unspecified: Secondary | ICD-10-CM

## 2020-02-15 DIAGNOSIS — E1165 Type 2 diabetes mellitus with hyperglycemia: Secondary | ICD-10-CM

## 2020-02-15 MED ORDER — NP THYROID 90 MG PO TABS: 90.0000 mg | ORAL_TABLET | Freq: Every day | ORAL | 3 refills | Status: DC

## 2020-02-15 NOTE — Progress Notes (Signed)
Metrics: Intervention Frequency ACO  Documented Smoking Status Yearly  Screened one or more times in 24 months  Cessation Counseling or  Active cessation medication Past 24 months  Past 24 months   Guideline developer: UpToDate (See UpToDate for funding source) Date Released: 2014       Wellness Office Visit  Subjective:  Patient ID: Meagan Molina, female    DOB: 1950-08-12  Age: 69 y.o. MRN: [id number]  CC: This lady comes in for follow-up of hypothyroidism, bioidentical hormone therapy and menopausal symptoms. HPI  Since starting estradiol and progesterone, her hot flashes have improved and not completely gone though yet. I reviewed her thyroid function test from the last visit and she still needs to be further optimized. She has been drinking more water. She is also back to eating healthier diet with intermittent fasting combined with a plant-based diet. Her hemoglobin A1c is now only 5%.  This is great work!  She continues to take glipizide and Actos for her diabetes. Past Medical History:  Diagnosis Date  . Allergic rhinitis, unspecified   . Anxiety disorder, unspecified   . Atherosclerotic heart disease of native coronary artery without angina pectoris   . Cataract   . Coronary artery disease    cardiac catheterization 03/01/12, 11/14/03, 10/10/2003, 02/21/1999, 08/14/1985  . Diabetes mellitus (HCC)    non-insulin dependent  . Essential (primary) hypertension   . Fibromyalgia   . Fibromyalgia   . Hyperlipidemia, mixed   . Hyperlipidemia, unspecified   . Hypertension   . Hypertensive heart disease without heart failure   . Hypothyroidism   . Hypothyroidism, unspecified   . Major depressive disorder, single episode, moderate (HCC)   . Other chest pain   . Other muscle spasm   . Primary insomnia   . Shortness of breath    2D Echocardiogram 02/03/12 with EF of greater than 55%  . Shortness of breath   . Type 2 diabetes mellitus with hyperglycemia (HCC)   . Type 2  diabetes mellitus with hyperglycemia [facility])    Past Surgical History:  Procedure Laterality Date  . ABDOMINAL HYSTERECTOMY     TAH&BSO Benign mass 1987  . BREAST BIOPSY    . BREAST SURGERY    . CARDIAC CATHETERIZATION  11/14/2003, 10/10/2003   2.25x13mm cutting balloon performed in mid left anterior descending, stenosis reduced from 80% to 0% with TIMI-3 to TIMI-3 flow maintained.   On 10/10/03 percutaneous angioplasty and stenting of proximal circumflex coronary artery with 3.5x41mm Cypher stent, post dilated with 3.5x98mm PowerSail at 16 atmospheric pressure maximum, stenosis reduced  from 99% to 0% with TIMI-2 to 3 flow maintained   . EYE SURGERY    . LEFT HEART CATHETERIZATION WITH CORONARY ANGIOGRAM N/A 03/01/2012   Procedure: LEFT HEART CATHETERIZATION WITH CORONARY ANGIOGRAM;  Surgeon: Runell Gess, MD;  Location: [facility] CATH LAB;  Service: Cardiovascular;  Laterality: N/A;  . salpingo-oophorectomy comp/prtl uni/bi spx       Family History  Problem Relation Age of Onset  . Cancer Mother        breast cancer deceased in her 85's  . Coronary artery disease Father        myocardial infarction 09/2003  . Pneumonia Brother     Social History   Social History Narrative   Widow since last 15 years.Lives alone.   Social History   Tobacco Use  . Smoking status: Never Smoker  . Smokeless tobacco: Never Used  Substance Use Topics  . Alcohol use: No  Current Meds  Medication Sig  . ACCU-CHEK AVIVA PLUS test strip 1 each daily.  Marland Kitchen aspirin EC 81 MG tablet Take 81 mg by mouth daily.  . carvedilol (COREG) 12.5 MG tablet Take 12.5 mg by mouth 2 (two) times daily with a meal.  . clobetasol (TEMOVATE) 0.05 % external solution Apply 1 application topically 2 (two) times daily as needed. Itchy scalp  . clopidogrel (PLAVIX) 75 MG tablet Take 1 tablet (75 mg total) by mouth daily.  Marland Kitchen estradiol (ESTRACE) 0.5 MG tablet Take 1 tablet (0.5 mg total) by mouth daily.  Marland Kitchen glipiZIDE (GLUCOTROL  XL) 5 MG 24 hr tablet Take 5 mg by mouth at bedtime.  Marland Kitchen glucose blood (ACCU-CHEK AVIVA PLUS) test strip Use to test blood sugar once a day (Dx:  E11.65)  . lisinopril (ZESTRIL) 5 MG tablet Take 1 tablet (5 mg total) by mouth daily.  . pioglitazone (ACTOS) 15 MG tablet Take 1 tablet by mouth daily. For diabetes  . progesterone (PROMETRIUM) 200 MG capsule Take 1 capsule (200 mg total) by mouth daily.  . simvastatin (ZOCOR) 40 MG tablet Take 1 tablet (40 mg total) by mouth every evening.  . thyroid (NP THYROID) 60 MG tablet Take 1 tablet (60 mg total) by mouth daily before breakfast.       Depression screen [facility] 2/9 07/17/2019  Decreased Interest 0  Down, Depressed, Hopeless 0  PHQ - 2 Score 0     Objective:   Today's Vitals: BP (!) 135/80 (BP Location: Left Arm, Patient Position: Sitting, Cuff Size: Normal)   Pulse 64   Temp 99.7 F (37.6 C) (Temporal)   Ht 5\' 5"  (1.651 m)   Wt 192 lb 12.8 oz (87.5 kg)   SpO2 99%   BMI 32.08 kg/m  Vitals with BMI 02/15/2020 01/24/2020 12/19/2019  Height 5\' 5"  5\' 5"  5\' 5"   Weight 192 lbs 13 oz 191 lbs 13 oz 196 lbs 6 oz  BMI 32.08 31.92 32.68  Systolic 135 150 962  Diastolic 80 90 77  Pulse 64 78 77     Physical Exam  She looks systemically well.  She remains obese and her weight is stable.  Blood pressure is acceptable and somewhat better than last time.     Assessment   1. Acquired hypothyroidism   2. Type 2 diabetes mellitus with hyperglycemia, without long-term current use of insulin (HCC)   3. Essential (primary) hypertension   4. Primary ovarian failure       Tests ordered No orders of the defined types were placed in this encounter.    Plan: 1. I think we can further optimize her hypothyroidism and I have sent a prescription of NP thyroid 90 mg tablets I want her to take NP thyroid 60 mg daily alternating with NP thyroid 90 mg daily. 2. As far as her diabetes is concerned, I think that we can manage this on diet alone so  I have told her to discontinue glipizide and Actos now.  She will monitor her glucose levels closely when she does this. 3. She will continue with lisinopril for hypertension as before and this is controlling her blood pressure well. 4. She will continue with estradiol and progesterone for menopausal symptoms and we will likely check her levels the next visit. 5. Follow-up in 3 months.   Meds ordered this encounter  Medications  . NP THYROID 90 MG tablet    Sig: Take 1 tablet (90 mg total) by mouth daily.  Dispense:  30 tablet    Refill:  3    Adlee Paar Normajean Glasgow, MD

## 2020-03-04 ENCOUNTER — Telehealth (INDEPENDENT_AMBULATORY_CARE_PROVIDER_SITE_OTHER): Payer: Self-pay | Admitting: Internal Medicine

## 2020-03-04 NOTE — Telephone Encounter (Signed)
Please ask her to make an appointment so I can reevaluate her.  Thanks.

## 2020-03-07 ENCOUNTER — Other Ambulatory Visit: Payer: Self-pay

## 2020-03-07 ENCOUNTER — Encounter (INDEPENDENT_AMBULATORY_CARE_PROVIDER_SITE_OTHER): Payer: Self-pay | Admitting: Internal Medicine

## 2020-03-07 ENCOUNTER — Ambulatory Visit (INDEPENDENT_AMBULATORY_CARE_PROVIDER_SITE_OTHER): Payer: Medicare Other | Admitting: Internal Medicine

## 2020-03-07 VITALS — BP 130/80 | HR 78 | Temp 97.3°F | Resp 18 | Ht 65.0 in | Wt 190.2 lb

## 2020-03-07 DIAGNOSIS — M79602 Pain in left arm: Secondary | ICD-10-CM | POA: Diagnosis not present

## 2020-03-07 NOTE — Progress Notes (Signed)
Metrics: Intervention Frequency ACO  Documented Smoking Status Yearly  Screened one or more times in 24 months  Cessation Counseling or  Active cessation medication Past 24 months  Past 24 months   Guideline developer: UpToDate (See UpToDate for funding source) Date Released: 2014       Wellness Office Visit  Subjective:  Patient ID: Meagan Molina, female    DOB: Jul 28, 1950  Age: 69 y.o. MRN: [id number]  CC: Left arm pain HPI  Patient comes in for an acute visit with the above complaint which she has had now for the last 2 months or so.  She did mention it to me previously and I told her to use ibuprofen for a few days to see if it will help.  Unfortunately, she did use it for longer but it has not really helped her.  It seems to be a localized pain and she does not remember any major trauma.  It is not related to exertion. Past Medical History:  Diagnosis Date  . Allergic rhinitis, unspecified   . Anxiety disorder, unspecified   . Atherosclerotic heart disease of native coronary artery without angina pectoris   . Cataract   . Coronary artery disease    cardiac catheterization 03/01/12, 11/14/03, 10/10/2003, 02/21/1999, 08/14/1985  . Diabetes mellitus (HCC)    non-insulin dependent  . Essential (primary) hypertension   . Fibromyalgia   . Fibromyalgia   . Hyperlipidemia, mixed   . Hyperlipidemia, unspecified   . Hypertension   . Hypertensive heart disease without heart failure   . Hypothyroidism   . Hypothyroidism, unspecified   . Major depressive disorder, single episode, moderate (HCC)   . Other chest pain   . Other muscle spasm   . Primary insomnia   . Shortness of breath    2D Echocardiogram 02/03/12 with EF of greater than 55%  . Shortness of breath   . Type 2 diabetes mellitus with hyperglycemia (HCC)   . Type 2 diabetes mellitus with hyperglycemia [facility])    Past Surgical History:  Procedure Laterality Date  . ABDOMINAL HYSTERECTOMY     TAH&BSO Benign mass 1987  .  BREAST BIOPSY    . BREAST SURGERY    . CARDIAC CATHETERIZATION  11/14/2003, 10/10/2003   2.25x29mm cutting balloon performed in mid left anterior descending, stenosis reduced from 80% to 0% with TIMI-3 to TIMI-3 flow maintained.   On 10/10/03 percutaneous angioplasty and stenting of proximal circumflex coronary artery with 3.5x79mm Cypher stent, post dilated with 3.5x42mm PowerSail at 16 atmospheric pressure maximum, stenosis reduced  from 99% to 0% with TIMI-2 to 3 flow maintained   . EYE SURGERY    . LEFT HEART CATHETERIZATION WITH CORONARY ANGIOGRAM N/A 03/01/2012   Procedure: LEFT HEART CATHETERIZATION WITH CORONARY ANGIOGRAM;  Surgeon: Runell Gess, MD;  Location: [facility] CATH LAB;  Service: Cardiovascular;  Laterality: N/A;  . salpingo-oophorectomy comp/prtl uni/bi spx       Family History  Problem Relation Age of Onset  . Cancer Mother        breast cancer deceased in her 55's  . Coronary artery disease Father        myocardial infarction 09/2003  . Pneumonia Brother     Social History   Social History Narrative   Widow since last 15 years.Lives alone.   Social History   Tobacco Use  . Smoking status: Never Smoker  . Smokeless tobacco: Never Used  Substance Use Topics  . Alcohol use: No    Current  Meds  Medication Sig  . ACCU-CHEK AVIVA PLUS test strip 1 each daily.  Marland Kitchen aspirin EC 81 MG tablet Take 81 mg by mouth daily.  . carvedilol (COREG) 12.5 MG tablet Take 12.5 mg by mouth 2 (two) times daily with a meal.  . clobetasol (TEMOVATE) 0.05 % external solution Apply 1 application topically 2 (two) times daily as needed. Itchy scalp  . clopidogrel (PLAVIX) 75 MG tablet Take 1 tablet (75 mg total) by mouth daily.  Marland Kitchen estradiol (ESTRACE) 0.5 MG tablet Take 1 tablet (0.5 mg total) by mouth daily.  Marland Kitchen glipiZIDE (GLUCOTROL XL) 5 MG 24 hr tablet Take 5 mg by mouth at bedtime.  Marland Kitchen glucose blood (ACCU-CHEK AVIVA PLUS) test strip Use to test blood sugar once a day (Dx:  E11.65)  .  lisinopril (ZESTRIL) 5 MG tablet Take 1 tablet (5 mg total) by mouth daily.  . NP THYROID 90 MG tablet Take 1 tablet (90 mg total) by mouth daily.  . progesterone (PROMETRIUM) 200 MG capsule Take 1 capsule (200 mg total) by mouth daily.  . simvastatin (ZOCOR) 40 MG tablet Take 1 tablet (40 mg total) by mouth every evening.  . thyroid (NP THYROID) 60 MG tablet Take 1 tablet (60 mg total) by mouth daily before breakfast.  . [DISCONTINUED] pioglitazone (ACTOS) 15 MG tablet Take 1 tablet by mouth daily. For diabetes      Depression screen [facility] 2/9 07/17/2019  Decreased Interest 0  Down, Depressed, Hopeless 0  PHQ - 2 Score 0     Objective:   Today's Vitals: BP 130/80 (BP Location: Right Arm, Patient Position: Sitting, Cuff Size: Normal)   Pulse 78   Temp (!) 97.3 F (36.3 C) (Temporal)   Resp 18   Ht 5\' 5"  (1.651 m)   Wt 190 lb 3.2 oz (86.3 kg)   SpO2 98%   BMI 31.65 kg/m  Vitals with BMI 03/07/2020 02/15/2020 01/24/2020  Height 5\' 5"  5\' 5"  5\' 5"   Weight 190 lbs 3 oz 192 lbs 13 oz 191 lbs 13 oz  BMI 31.65 32.08 31.92  Systolic 130 135 409  Diastolic 80 80 90  Pulse 78 64 78     Physical Exam  On examination, she appears to be tender in the mid left humerus laterally, this seems to reproduce the pain.     Assessment   1. Pain of left upper extremity       Tests ordered Orders Placed This Encounter  Procedures  . Ambulatory referral to Orthopedic Surgery     Plan: 1. I am not quite sure as to the nature of the cause of this pain and I will refer to orthopedics.  Most likely she will require plain x-ray films and further evaluation. 2. Today also I discussed Covid vaccination and I urged her to get vaccinated and discussed with her the signs behind the vaccine, its safety and efficacy.   No orders of the defined types were placed in this encounter.   Wilson Singer, MD

## 2020-03-15 DIAGNOSIS — M25522 Pain in left elbow: Secondary | ICD-10-CM | POA: Diagnosis not present

## 2020-03-15 DIAGNOSIS — M66822 Spontaneous rupture of other tendons, left upper arm: Secondary | ICD-10-CM | POA: Diagnosis not present

## 2020-03-20 ENCOUNTER — Other Ambulatory Visit: Payer: Self-pay | Admitting: Family Medicine

## 2020-03-20 DIAGNOSIS — I119 Hypertensive heart disease without heart failure: Secondary | ICD-10-CM

## 2020-03-25 ENCOUNTER — Other Ambulatory Visit (INDEPENDENT_AMBULATORY_CARE_PROVIDER_SITE_OTHER): Payer: Self-pay

## 2020-03-25 DIAGNOSIS — I119 Hypertensive heart disease without heart failure: Secondary | ICD-10-CM

## 2020-03-25 MED ORDER — LISINOPRIL 5 MG PO TABS
5.0000 mg | ORAL_TABLET | Freq: Every day | ORAL | 1 refills | Status: DC
Start: 2020-03-25 — End: 2020-09-16

## 2020-03-27 ENCOUNTER — Encounter: Payer: Self-pay | Admitting: Orthopedic Surgery

## 2020-04-03 ENCOUNTER — Encounter (INDEPENDENT_AMBULATORY_CARE_PROVIDER_SITE_OTHER): Payer: Self-pay | Admitting: Internal Medicine

## 2020-04-04 ENCOUNTER — Other Ambulatory Visit (INDEPENDENT_AMBULATORY_CARE_PROVIDER_SITE_OTHER): Payer: Self-pay | Admitting: Internal Medicine

## 2020-04-04 DIAGNOSIS — I119 Hypertensive heart disease without heart failure: Secondary | ICD-10-CM

## 2020-04-04 MED ORDER — CLOPIDOGREL BISULFATE 75 MG PO TABS: 75.0000 mg | ORAL_TABLET | Freq: Every day | ORAL | 1 refills | Status: DC

## 2020-04-19 ENCOUNTER — Other Ambulatory Visit (INDEPENDENT_AMBULATORY_CARE_PROVIDER_SITE_OTHER): Payer: Self-pay | Admitting: Nurse Practitioner

## 2020-04-22 ENCOUNTER — Encounter (INDEPENDENT_AMBULATORY_CARE_PROVIDER_SITE_OTHER): Payer: Self-pay | Admitting: Internal Medicine

## 2020-04-22 ENCOUNTER — Other Ambulatory Visit (INDEPENDENT_AMBULATORY_CARE_PROVIDER_SITE_OTHER): Payer: Self-pay | Admitting: Nurse Practitioner

## 2020-04-29 ENCOUNTER — Other Ambulatory Visit: Payer: Self-pay

## 2020-04-29 ENCOUNTER — Ambulatory Visit (INDEPENDENT_AMBULATORY_CARE_PROVIDER_SITE_OTHER): Payer: Medicare Other | Admitting: Internal Medicine

## 2020-04-29 ENCOUNTER — Encounter (INDEPENDENT_AMBULATORY_CARE_PROVIDER_SITE_OTHER): Payer: Self-pay | Admitting: Internal Medicine

## 2020-04-29 VITALS — BP 130/77 | HR 68 | Temp 97.1°F | Resp 18 | Ht 65.0 in | Wt 192.2 lb

## 2020-04-29 DIAGNOSIS — E039 Hypothyroidism, unspecified: Secondary | ICD-10-CM

## 2020-04-29 DIAGNOSIS — E559 Vitamin D deficiency, unspecified: Secondary | ICD-10-CM

## 2020-04-29 DIAGNOSIS — I10 Essential (primary) hypertension: Secondary | ICD-10-CM

## 2020-04-29 DIAGNOSIS — E1165 Type 2 diabetes mellitus with hyperglycemia: Secondary | ICD-10-CM | POA: Diagnosis not present

## 2020-04-29 DIAGNOSIS — E2839 Other primary ovarian failure: Secondary | ICD-10-CM

## 2020-04-29 NOTE — Progress Notes (Signed)
Metrics: Intervention Frequency ACO  Documented Smoking Status Yearly  Screened one or more times in 24 months  Cessation Counseling or  Active cessation medication Past 24 months  Past 24 months   Guideline developer: UpToDate (See UpToDate for funding source) Date Released: 2014       Wellness Office Visit  Subjective:  Patient ID: Meagan Molina, female    DOB: [birth year]  Age: 69 y.o. MRN: [id number]  CC: This lady comes in for follow-up of hypertension, menopausal symptoms, diabetes, hypothyroidism and vitamin D deficiency. HPI  She tells me that estradiol and progesterone have helped her and she is sleeping very well now.  However her energy levels are still low.  We had increased her desiccated NP thyroid so that she is taking 60 mg on 1 day alternating with 90 mg on another day. She continues to take vitamin D3 5000 units daily for vitamin D deficiency. Her testosterone levels are suboptimal but we have not approach this subject yet. As far as her diabetes is concerned, her hemoglobin A1c was 5% on the last visit but when she stopped glipizide, her numbers went higher so she is now back on taking glipizide. Past Medical History:  Diagnosis Date  . Allergic rhinitis, unspecified   . Anxiety disorder, unspecified   . Atherosclerotic heart disease of native coronary artery without angina pectoris   . Cataract   . Coronary artery disease    cardiac catheterization 03/01/12, 11/14/03, 10/10/2003, 02/21/1999, 08/14/1985  . Diabetes mellitus (HCC)    non-insulin dependent  . Essential (primary) hypertension   . Fibromyalgia   . Fibromyalgia   . Hyperlipidemia, mixed   . Hyperlipidemia, unspecified   . Hypertension   . Hypertensive heart disease without heart failure   . Hypothyroidism   . Hypothyroidism, unspecified   . Major depressive disorder, single episode, moderate (HCC)   . Other chest pain   . Other muscle spasm   . Primary insomnia   . Shortness of breath    2D  Echocardiogram 02/03/12 with EF of greater than 55%  . Shortness of breath   . Type 2 diabetes mellitus with hyperglycemia (HCC)   . Type 2 diabetes mellitus with hyperglycemia [facility])    Past Surgical History:  Procedure Laterality Date  . ABDOMINAL HYSTERECTOMY     TAH&BSO Benign mass 1987  . BREAST BIOPSY    . BREAST SURGERY    . CARDIAC CATHETERIZATION  11/14/2003, 10/10/2003   2.25x24mm cutting balloon performed in mid left anterior descending, stenosis reduced from 80% to 0% with TIMI-3 to TIMI-3 flow maintained.   On 10/10/03 percutaneous angioplasty and stenting of proximal circumflex coronary artery with 3.5x67mm Cypher stent, post dilated with 3.5x82mm PowerSail at 16 atmospheric pressure maximum, stenosis reduced  from 99% to 0% with TIMI-2 to 3 flow maintained   . EYE SURGERY    . LEFT HEART CATHETERIZATION WITH CORONARY ANGIOGRAM N/A 03/01/2012   Procedure: LEFT HEART CATHETERIZATION WITH CORONARY ANGIOGRAM;  Surgeon: Runell Gess, MD;  Location: [facility] CATH LAB;  Service: Cardiovascular;  Laterality: N/A;  . salpingo-oophorectomy comp/prtl uni/bi spx       Family History  Problem Relation Age of Onset  . Cancer Mother        breast cancer deceased in her 15's  . Coronary artery disease Father        myocardial infarction 09/2003  . Pneumonia Brother     Social History   Social History Narrative   Widow since last  15 years.Lives alone.   Social History   Tobacco Use  . Smoking status: Never Smoker  . Smokeless tobacco: Never Used  Substance Use Topics  . Alcohol use: No    Current Meds  Medication Sig  . ACCU-CHEK AVIVA PLUS test strip 1 each daily.  Marland Kitchen aspirin EC 81 MG tablet Take 81 mg by mouth daily.  . carvedilol (COREG) 12.5 MG tablet Take 12.5 mg by mouth 2 (two) times daily with a meal.  . Cholecalciferol (VITAMIN D3) 125 MCG (5000 UT) CHEW Chew 1 tablet by mouth daily.  . clobetasol (TEMOVATE) 0.05 % external solution Apply 1 application topically 2  (two) times daily as needed. Itchy scalp  . clopidogrel (PLAVIX) 75 MG tablet Take 1 tablet (75 mg total) by mouth daily.  Marland Kitchen estradiol (ESTRACE) 0.5 MG tablet Take 1 tablet (0.5 mg total) by mouth daily.  Marland Kitchen glipiZIDE (GLUCOTROL XL) 5 MG 24 hr tablet Take 5 mg by mouth at bedtime.  Marland Kitchen glucose blood (ACCU-CHEK AVIVA PLUS) test strip Use to test blood sugar once a day (Dx:  E11.65)  . lisinopril (ZESTRIL) 5 MG tablet Take 1 tablet (5 mg total) by mouth daily.  . NP THYROID 90 MG tablet Take 1 tablet (90 mg total) by mouth daily.  . progesterone (PROMETRIUM) 200 MG capsule Take 1 capsule by mouth once daily  . simvastatin (ZOCOR) 40 MG tablet Take 1 tablet (40 mg total) by mouth every evening.  . thyroid (NP THYROID) 60 MG tablet Take 1 tablet (60 mg total) by mouth daily before breakfast.      Depression screen [facility] 2/9 04/29/2020 07/17/2019  Decreased Interest 0 0  Down, Depressed, Hopeless 0 0  PHQ - 2 Score 0 0  Altered sleeping 0 -  Tired, decreased energy 1 -  Change in appetite 1 -  Feeling bad or failure about yourself  0 -  Trouble concentrating 0 -  Moving slowly or fidgety/restless 0 -  Suicidal thoughts 0 -  PHQ-9 Score 2 -  Difficult doing work/chores Not difficult at all -     Objective:   Today's Vitals: BP 130/77 (BP Location: Left Arm, Patient Position: Sitting, Cuff Size: Normal)   Pulse 68   Temp (!) 97.1 F (36.2 C) (Temporal)   Resp 18   Ht 5\' 5"  (1.651 m)   Wt 192 lb 3.2 oz (87.2 kg)   SpO2 96%   BMI 31.98 kg/m  Vitals with BMI 04/29/2020 03/07/2020 02/15/2020  Height 5\' 5"  5\' 5"  5\' 5"   Weight 192 lbs 3 oz 190 lbs 3 oz 192 lbs 13 oz  BMI 31.98 31.65 32.08  Systolic 130 130 409  Diastolic 77 80 80  Pulse 68 78 64     Physical Exam  She looks systemically well, remains obese.  She has not really lost any weight in fact has gained 2 pounds.  Blood pressure is well controlled.     Assessment   1. Essential (primary) hypertension   2. Primary  ovarian failure   3. Type 2 diabetes mellitus with hyperglycemia, without long-term current use of insulin (HCC)   4. Acquired hypothyroidism   5. Vitamin D deficiency disease       Tests ordered Orders Placed This Encounter  Procedures  . Hemoglobin A1c  . Estradiol  . Progesterone  . T3, free  . TSH  . VITAMIN D 25 Hydroxy (Vit-D Deficiency, Fractures)     Plan: 1. She will continue with the same  antihypertensive therapy listed above for hypertension as this is controlling her blood pressure well. 2. She will continue with estradiol and progesterone and we will check levels today.  I suspect we will need to optimize further.  Next visit we may have to discuss testosterone therapy. 3. I will check an A1c to see where her diabetes is now and we may need to adjust antidiabetic medications to avoid using glipizide which will increase insulin levels. 4. We will check a T3 level and see if we need to further optimize thyroid medications. 5. She will continue on vitamin D3 5000 units daily and I will check levels today. 6. Follow-up in about 6 weeks time.   No orders of the defined types were placed in this encounter.   Wilson Singer, MD

## 2020-04-30 ENCOUNTER — Other Ambulatory Visit (INDEPENDENT_AMBULATORY_CARE_PROVIDER_SITE_OTHER): Payer: Self-pay | Admitting: Internal Medicine

## 2020-04-30 LAB — VITAMIN D 25 HYDROXY (VIT D DEFICIENCY, FRACTURES): Vit D, 25-Hydroxy: 27 ng/mL — ABNORMAL LOW (ref 30–100)

## 2020-04-30 LAB — ESTRADIOL: Estradiol: 23 pg/mL

## 2020-04-30 LAB — TSH: TSH: 1.68 mIU/L (ref 0.40–4.50)

## 2020-04-30 LAB — HEMOGLOBIN A1C
Hgb A1c MFr Bld: 5.7 % of total Hgb — ABNORMAL HIGH (ref <5.7)
Mean Plasma Glucose: 117 (calc)
eAG (mmol/L): 6.5 (calc)

## 2020-04-30 LAB — T3, FREE: T3, Free: 3.6 pg/mL (ref 2.3–4.2)

## 2020-04-30 LAB — PROGESTERONE: Progesterone: 33.7 ng/mL

## 2020-04-30 MED ORDER — ESTRADIOL 1 MG PO TABS: 1.0000 mg | ORAL_TABLET | Freq: Every day | ORAL | 3 refills | Status: DC

## 2020-04-30 MED ORDER — NP THYROID 90 MG PO TABS
90.0000 mg | ORAL_TABLET | Freq: Every day | ORAL | 3 refills | Status: DC
Start: 2020-04-30 — End: 2020-11-25

## 2020-05-01 ENCOUNTER — Encounter (INDEPENDENT_AMBULATORY_CARE_PROVIDER_SITE_OTHER): Payer: Self-pay | Admitting: Internal Medicine

## 2020-05-01 ENCOUNTER — Other Ambulatory Visit (INDEPENDENT_AMBULATORY_CARE_PROVIDER_SITE_OTHER): Payer: Self-pay | Admitting: Internal Medicine

## 2020-05-01 DIAGNOSIS — E785 Hyperlipidemia, unspecified: Secondary | ICD-10-CM

## 2020-05-01 MED ORDER — SIMVASTATIN 40 MG PO TABS: 40.0000 mg | ORAL_TABLET | Freq: Every evening | ORAL | 1 refills | Status: DC

## 2020-05-14 ENCOUNTER — Other Ambulatory Visit (INDEPENDENT_AMBULATORY_CARE_PROVIDER_SITE_OTHER): Payer: Self-pay | Admitting: Internal Medicine

## 2020-05-14 ENCOUNTER — Encounter (INDEPENDENT_AMBULATORY_CARE_PROVIDER_SITE_OTHER): Payer: Self-pay | Admitting: Internal Medicine

## 2020-05-14 MED ORDER — CARVEDILOL 12.5 MG PO TABS
12.5000 mg | ORAL_TABLET | Freq: Two times a day (BID) | ORAL | 1 refills | Status: DC
Start: 2020-05-14 — End: 2023-11-01

## 2020-06-11 ENCOUNTER — Ambulatory Visit (INDEPENDENT_AMBULATORY_CARE_PROVIDER_SITE_OTHER): Payer: Medicare Other | Admitting: Internal Medicine

## 2020-06-11 ENCOUNTER — Other Ambulatory Visit: Payer: Self-pay

## 2020-06-11 ENCOUNTER — Encounter (INDEPENDENT_AMBULATORY_CARE_PROVIDER_SITE_OTHER): Payer: Self-pay | Admitting: Internal Medicine

## 2020-06-11 VITALS — BP 126/78 | HR 78 | Temp 97.7°F | Resp 18 | Ht 65.0 in | Wt 202.4 lb

## 2020-06-11 DIAGNOSIS — E039 Hypothyroidism, unspecified: Secondary | ICD-10-CM

## 2020-06-11 DIAGNOSIS — I10 Essential (primary) hypertension: Secondary | ICD-10-CM

## 2020-06-11 DIAGNOSIS — E1165 Type 2 diabetes mellitus with hyperglycemia: Secondary | ICD-10-CM | POA: Diagnosis not present

## 2020-06-11 DIAGNOSIS — E2839 Other primary ovarian failure: Secondary | ICD-10-CM

## 2020-06-11 MED ORDER — ACCU-CHEK AVIVA PLUS VI STRP: 1.0000 | ORAL_STRIP | Freq: Every day | 2 refills | Status: AC

## 2020-06-11 MED ORDER — SITAGLIPTIN PHOSPHATE 25 MG PO TABS: 25.0000 mg | ORAL_TABLET | Freq: Every day | ORAL | 3 refills | Status: DC

## 2020-06-11 NOTE — Progress Notes (Signed)
Metrics: Intervention Frequency ACO  Documented Smoking Status Yearly  Screened one or more times in 24 months  Cessation Counseling or  Active cessation medication Past 24 months  Past 24 months   Guideline developer: UpToDate (See UpToDate for funding source) Date Released: 2014       Wellness Office Visit  Subjective:  Patient ID: Meagan Molina, female    DOB: 10-Aug-1950  Age: 69 y.o. MRN: [id number]  CC: This lady comes in for follow-up of hypertension, diabetes, hypothyroidism and menopausal symptoms and bioidentical hormone therapy. HPI  She has gained 10 pounds and she is not sure why.  She has tolerated the higher dose of estradiol at 1 mg daily. She has also tolerated higher dose of NP thyroid 90 mg daily. She has gone back to taking glipizide and she is taking this as well as Actos.  Her last hemoglobin A1c was 5.7%. Past Medical History:  Diagnosis Date  . Allergic rhinitis, unspecified   . Anxiety disorder, unspecified   . Atherosclerotic heart disease of native coronary artery without angina pectoris   . Cataract   . Coronary artery disease    cardiac catheterization 03/01/12, 11/14/03, 10/10/2003, 02/21/1999, 08/14/1985  . Diabetes mellitus (HCC)    non-insulin dependent  . Essential (primary) hypertension   . Fibromyalgia   . Fibromyalgia   . Hyperlipidemia, mixed   . Hyperlipidemia, unspecified   . Hypertension   . Hypertensive heart disease without heart failure   . Hypothyroidism   . Hypothyroidism, unspecified   . Major depressive disorder, single episode, moderate (HCC)   . Other chest pain   . Other muscle spasm   . Primary insomnia   . Shortness of breath    2D Echocardiogram 02/03/12 with EF of greater than 55%  . Shortness of breath   . Type 2 diabetes mellitus with hyperglycemia (HCC)   . Type 2 diabetes mellitus with hyperglycemia [facility])    Past Surgical History:  Procedure Laterality Date  . ABDOMINAL HYSTERECTOMY     TAH&BSO Benign mass 1987   . BREAST BIOPSY    . BREAST SURGERY    . CARDIAC CATHETERIZATION  11/14/2003, 10/10/2003   2.25x56mm cutting balloon performed in mid left anterior descending, stenosis reduced from 80% to 0% with TIMI-3 to TIMI-3 flow maintained.   On 10/10/03 percutaneous angioplasty and stenting of proximal circumflex coronary artery with 3.5x34mm Cypher stent, post dilated with 3.5x47mm PowerSail at 16 atmospheric pressure maximum, stenosis reduced  from 99% to 0% with TIMI-2 to 3 flow maintained   . EYE SURGERY    . LEFT HEART CATHETERIZATION WITH CORONARY ANGIOGRAM N/A 03/01/2012   Procedure: LEFT HEART CATHETERIZATION WITH CORONARY ANGIOGRAM;  Surgeon: Runell Gess, MD;  Location: [facility] CATH LAB;  Service: Cardiovascular;  Laterality: N/A;  . salpingo-oophorectomy comp/prtl uni/bi spx       Family History  Problem Relation Age of Onset  . Cancer Mother        breast cancer deceased in her 78's  . Coronary artery disease Father        myocardial infarction 09/2003  . Pneumonia Brother     Social History   Social History Narrative   Widow since last 15 years.Lives alone.   Social History   Tobacco Use  . Smoking status: Never Smoker  . Smokeless tobacco: Never Used  Substance Use Topics  . Alcohol use: No    Current Meds  Medication Sig  . ACCU-CHEK AVIVA PLUS test strip 1 each  by Other route daily. E11.65  . aspirin EC 81 MG tablet Take 81 mg by mouth daily.  . carvedilol (COREG) 12.5 MG tablet Take 1 tablet (12.5 mg total) by mouth 2 (two) times daily with a meal.  . Cholecalciferol (VITAMIN D3) 125 MCG (5000 UT) CHEW Chew 1 tablet by mouth daily.  . clobetasol (TEMOVATE) 0.05 % external solution Apply 1 application topically 2 (two) times daily as needed. Itchy scalp  . clopidogrel (PLAVIX) 75 MG tablet Take 1 tablet (75 mg total) by mouth daily.  Marland Kitchen estradiol (ESTRACE) 1 MG tablet Take 1 tablet (1 mg total) by mouth daily.  Marland Kitchen glipiZIDE (GLUCOTROL XL) 5 MG 24 hr tablet Take 5 mg by  mouth at bedtime.  Marland Kitchen glucose blood (ACCU-CHEK AVIVA PLUS) test strip Use to test blood sugar once a day (Dx:  E11.65)  . glucose blood test strip Accu-Chek Aviva Plus test strips  USE 1 STRIP TO CHECK GLUCOSE ONCE DAILY  . lisinopril (ZESTRIL) 5 MG tablet Take 1 tablet (5 mg total) by mouth daily.  . NP THYROID 90 MG tablet Take 1 tablet (90 mg total) by mouth daily.  . pioglitazone (ACTOS) 15 MG tablet Take 15 mg by mouth daily.  . progesterone (PROMETRIUM) 200 MG capsule Take 1 capsule by mouth once daily  . simvastatin (ZOCOR) 40 MG tablet Take 1 tablet (40 mg total) by mouth every evening.  . [DISCONTINUED] ACCU-CHEK AVIVA PLUS test strip 1 each daily.      Depression screen [facility] 2/9 04/29/2020 07/17/2019  Decreased Interest 0 0  Down, Depressed, Hopeless 0 0  PHQ - 2 Score 0 0  Altered sleeping 0 -  Tired, decreased energy 1 -  Change in appetite 1 -  Feeling bad or failure about yourself  0 -  Trouble concentrating 0 -  Moving slowly or fidgety/restless 0 -  Suicidal thoughts 0 -  PHQ-9 Score 2 -  Difficult doing work/chores Not difficult at all -     Objective:   Today's Vitals: BP 126/78 (BP Location: Right Arm, Patient Position: Sitting, Cuff Size: Normal)   Pulse 78   Temp 97.7 F (36.5 C) (Temporal)   Resp 18   Ht 5\' 5"  (1.651 m)   Wt 202 lb 6.4 oz (91.8 kg)   SpO2 96%   BMI 33.68 kg/m  Vitals with BMI 06/11/2020 04/29/2020 03/07/2020  Height 5\' 5"  5\' 5"  5\' 5"   Weight 202 lbs 6 oz 192 lbs 3 oz 190 lbs 3 oz  BMI 33.68 31.98 31.65  Systolic 126 130 518  Diastolic 78 77 80  Pulse 78 68 78     Physical Exam  She remains obese and she has gained 10 pounds since last visit.  Blood pressure in a good range.     Assessment   1. Essential (primary) hypertension   2. Primary ovarian failure   3. Type 2 diabetes mellitus with hyperglycemia, without long-term current use of insulin (HCC)   4. Acquired hypothyroidism       Tests ordered No orders of the  defined types were placed in this encounter.    Plan: 1. She will continue with lisinopril and carvedilol for hypertension.  This seems to be controlling her blood pressure reasonably well. 2. She will continue with estradiol 1 mg daily and progesterone 200 mg at night. 3. She will continue with a higher dose of NP thyroid 90 mg daily and we may well optimize further on the next visit. 4.  As far as her diabetes is concerned, after shared decision making, she will discontinue glipizide and Actos and replace this with Januvia 25 mg daily.  I explained the rationale for this. 5. Influenza high-dose vaccine was given today. 6. I continue to talk to her about COVID-19 vaccination as she has not had this done yet. 7. Follow-up in 2 months when we will do all the blood work.   Meds ordered this encounter  Medications  . sitaGLIPtin (JANUVIA) 25 MG tablet    Sig: Take 1 tablet (25 mg total) by mouth daily.    Dispense:  30 tablet    Refill:  3  . ACCU-CHEK AVIVA PLUS test strip    Sig: 1 each by Other route daily. E11.65    Dispense:  100 each    Refill:  2    Undray Allman Normajean Glasgow, MD

## 2020-06-12 ENCOUNTER — Encounter (INDEPENDENT_AMBULATORY_CARE_PROVIDER_SITE_OTHER): Payer: Self-pay | Admitting: Internal Medicine

## 2020-06-26 ENCOUNTER — Encounter: Payer: Self-pay | Admitting: Internal Medicine

## 2020-06-26 DIAGNOSIS — H04223 Epiphora due to insufficient drainage, bilateral lacrimal glands: Secondary | ICD-10-CM | POA: Diagnosis not present

## 2020-06-26 DIAGNOSIS — Z1211 Encounter for screening for malignant neoplasm of colon: Secondary | ICD-10-CM | POA: Diagnosis not present

## 2020-06-26 DIAGNOSIS — Z961 Presence of intraocular lens: Secondary | ICD-10-CM | POA: Diagnosis not present

## 2020-06-26 DIAGNOSIS — Z1212 Encounter for screening for malignant neoplasm of rectum: Secondary | ICD-10-CM | POA: Diagnosis not present

## 2020-07-03 DIAGNOSIS — H04223 Epiphora due to insufficient drainage, bilateral lacrimal glands: Secondary | ICD-10-CM | POA: Diagnosis not present

## 2020-07-03 DIAGNOSIS — Z961 Presence of intraocular lens: Secondary | ICD-10-CM | POA: Diagnosis not present

## 2020-07-04 LAB — COLOGUARD
COLOGUARD: NEGATIVE
Cologuard: NEGATIVE

## 2020-08-12 ENCOUNTER — Encounter (INDEPENDENT_AMBULATORY_CARE_PROVIDER_SITE_OTHER): Payer: Self-pay | Admitting: Internal Medicine

## 2020-08-12 MED ORDER — GLIPIZIDE ER 5 MG PO TB24: 5.0000 mg | ORAL_TABLET | Freq: Every day | ORAL | 3 refills | Status: DC

## 2020-08-16 ENCOUNTER — Encounter (INDEPENDENT_AMBULATORY_CARE_PROVIDER_SITE_OTHER): Payer: Self-pay | Admitting: Internal Medicine

## 2020-08-16 MED ORDER — ESTRADIOL 1 MG PO TABS
1.0000 mg | ORAL_TABLET | Freq: Every day | ORAL | 3 refills | Status: DC
Start: 2020-08-16 — End: 2020-11-27

## 2020-08-26 DIAGNOSIS — Z961 Presence of intraocular lens: Secondary | ICD-10-CM | POA: Diagnosis not present

## 2020-08-26 DIAGNOSIS — H00015 Hordeolum externum left lower eyelid: Secondary | ICD-10-CM | POA: Diagnosis not present

## 2020-08-26 DIAGNOSIS — E119 Type 2 diabetes mellitus without complications: Secondary | ICD-10-CM | POA: Diagnosis not present

## 2020-08-26 LAB — HM DIABETES EYE EXAM

## 2020-08-27 ENCOUNTER — Encounter (INDEPENDENT_AMBULATORY_CARE_PROVIDER_SITE_OTHER): Payer: Self-pay | Admitting: Internal Medicine

## 2020-08-27 ENCOUNTER — Other Ambulatory Visit: Payer: Self-pay

## 2020-08-27 ENCOUNTER — Ambulatory Visit (INDEPENDENT_AMBULATORY_CARE_PROVIDER_SITE_OTHER): Payer: Medicare Other | Admitting: Internal Medicine

## 2020-08-27 VITALS — BP 130/88 | HR 88 | Temp 97.3°F | Resp 18 | Ht 66.0 in | Wt 198.0 lb

## 2020-08-27 DIAGNOSIS — Z1159 Encounter for screening for other viral diseases: Secondary | ICD-10-CM

## 2020-08-27 DIAGNOSIS — I10 Essential (primary) hypertension: Secondary | ICD-10-CM | POA: Diagnosis not present

## 2020-08-27 DIAGNOSIS — E1165 Type 2 diabetes mellitus with hyperglycemia: Secondary | ICD-10-CM

## 2020-08-27 DIAGNOSIS — E785 Hyperlipidemia, unspecified: Secondary | ICD-10-CM

## 2020-08-27 DIAGNOSIS — E118 Type 2 diabetes mellitus with unspecified complications: Secondary | ICD-10-CM

## 2020-08-27 DIAGNOSIS — E559 Vitamin D deficiency, unspecified: Secondary | ICD-10-CM | POA: Diagnosis not present

## 2020-08-27 DIAGNOSIS — E039 Hypothyroidism, unspecified: Secondary | ICD-10-CM

## 2020-08-27 DIAGNOSIS — IMO0002 Reserved for concepts with insufficient information to code with codable children: Secondary | ICD-10-CM

## 2020-08-27 DIAGNOSIS — E2839 Other primary ovarian failure: Secondary | ICD-10-CM

## 2020-08-27 NOTE — Progress Notes (Signed)
Metrics: Intervention Frequency ACO  Documented Smoking Status Yearly  Screened one or more times in 24 months  Cessation Counseling or  Active cessation medication Past 24 months  Past 24 months   Guideline developer: UpToDate (See UpToDate for funding source) Date Released: 2014       Wellness Office Visit  Subjective:  Patient ID: Meagan Molina, female    DOB: 1950/10/11  Age: 70 y.o. MRN: [id number]  CC: This lady comes in for follow-up of her diabetes, hypertension, hypothyroidism, dyslipidemia, bioidentical hormone therapy and vitamin D deficiency. HPI  Unfortunately, Januvia was extremely expensive for her so she has gone back to taking glipizide.  She did stop Actos.  She has been focusing on nutrition and also is now exercising at the senior center here in [place]. She continues on estradiol and progesterone as before. She is taking vitamin D3 10,000 units daily as her vitamin D levels were very low previously. She continues on lisinopril for hypertension. She continues on NP thyroid for her hypothyroidism. Past Medical History:  Diagnosis Date  . Allergic rhinitis, unspecified   . Anxiety disorder, unspecified   . Atherosclerotic heart disease of native coronary artery without angina pectoris   . Cataract   . Coronary artery disease    cardiac catheterization 03/01/12, 11/14/03, 10/10/2003, 02/21/1999, 08/14/1985  . Diabetes mellitus (HCC)    non-insulin dependent  . Essential (primary) hypertension   . Fibromyalgia   . Fibromyalgia   . Hyperlipidemia, mixed   . Hyperlipidemia, unspecified   . Hypertension   . Hypertensive heart disease without heart failure   . Hypothyroidism   . Hypothyroidism, unspecified   . Major depressive disorder, single episode, moderate (HCC)   . Other chest pain   . Other muscle spasm   . Primary insomnia   . Shortness of breath    2D Echocardiogram 02/03/12 with EF of greater than 55%  . Shortness of breath   . Type 2 diabetes  mellitus with hyperglycemia (HCC)   . Type 2 diabetes mellitus with hyperglycemia [facility])    Past Surgical History:  Procedure Laterality Date  . ABDOMINAL HYSTERECTOMY     TAH&BSO Benign mass 1987  . BREAST BIOPSY    . BREAST SURGERY    . CARDIAC CATHETERIZATION  11/14/2003, 10/10/2003   2.25x6mm cutting balloon performed in mid left anterior descending, stenosis reduced from 80% to 0% with TIMI-3 to TIMI-3 flow maintained.   On 10/10/03 percutaneous angioplasty and stenting of proximal circumflex coronary artery with 3.5x28mm Cypher stent, post dilated with 3.5x41mm PowerSail at 16 atmospheric pressure maximum, stenosis reduced  from 99% to 0% with TIMI-2 to 3 flow maintained   . EYE SURGERY    . LEFT HEART CATHETERIZATION WITH CORONARY ANGIOGRAM N/A 03/01/2012   Procedure: LEFT HEART CATHETERIZATION WITH CORONARY ANGIOGRAM;  Surgeon: Runell Gess, MD;  Location: [facility] CATH LAB;  Service: Cardiovascular;  Laterality: N/A;  . salpingo-oophorectomy comp/prtl uni/bi spx       Family History  Problem Relation Age of Onset  . Cancer Mother        breast cancer deceased in her 36's  . Coronary artery disease Father        myocardial infarction 09/2003  . Pneumonia Brother     Social History   Social History Narrative   Widow since last 15 years.Lives alone.   Social History   Tobacco Use  . Smoking status: Never Smoker  . Smokeless tobacco: Never Used  Substance Use Topics  .  Alcohol use: No    Current Meds  Medication Sig  . ACCU-CHEK AVIVA PLUS test strip 1 each by Other route daily. E11.65  . aspirin EC 81 MG tablet Take 81 mg by mouth daily.  . carvedilol (COREG) 12.5 MG tablet Take 1 tablet (12.5 mg total) by mouth 2 (two) times daily with a meal.  . Cholecalciferol (VITAMIN D3) 125 MCG (5000 UT) CHEW Chew 2 tablets by mouth daily.  . clobetasol (TEMOVATE) 0.05 % external solution Apply 1 application topically 2 (two) times daily as needed. Itchy scalp  . clopidogrel  (PLAVIX) 75 MG tablet Take 1 tablet (75 mg total) by mouth daily.  Marland Kitchen estradiol (ESTRACE) 1 MG tablet Take 1 tablet (1 mg total) by mouth daily.  Marland Kitchen glipiZIDE (GLUCOTROL XL) 5 MG 24 hr tablet Take 1 tablet (5 mg total) by mouth at bedtime.  Marland Kitchen glucose blood (ACCU-CHEK AVIVA PLUS) test strip Use to test blood sugar once a day (Dx:  E11.65)  . glucose blood test strip Accu-Chek Aviva Plus test strips  USE 1 STRIP TO CHECK GLUCOSE ONCE DAILY  . lisinopril (ZESTRIL) 5 MG tablet Take 1 tablet (5 mg total) by mouth daily.  . NP THYROID 90 MG tablet Take 1 tablet (90 mg total) by mouth daily.  . progesterone (PROMETRIUM) 200 MG capsule Take 1 capsule by mouth once daily  . simvastatin (ZOCOR) 40 MG tablet Take 1 tablet (40 mg total) by mouth every evening.      Depression screen [facility] 2/9 08/27/2020 04/29/2020 07/17/2019  Decreased Interest 0 0 0  Down, Depressed, Hopeless 0 0 0  PHQ - 2 Score 0 0 0  Altered sleeping 0 0 -  Tired, decreased energy 0 1 -  Change in appetite 0 1 -  Feeling bad or failure about yourself  0 0 -  Trouble concentrating 0 0 -  Moving slowly or fidgety/restless 0 0 -  Suicidal thoughts 0 0 -  PHQ-9 Score 0 2 -  Difficult doing work/chores Not difficult at all Not difficult at all -     Objective:   Today's Vitals: BP 130/88 (BP Location: Right Arm, Patient Position: Sitting, Cuff Size: Normal)   Pulse 88   Temp (!) 97.3 F (36.3 C) (Temporal)   Resp 18   Ht 5\' 6"  (1.676 m)   Wt 198 lb (89.8 kg)   SpO2 98%   BMI 31.96 kg/m  Vitals with BMI 08/27/2020 06/11/2020 04/29/2020  Height 5\' 6"  5\' 5"  5\' 5"   Weight 198 lbs 202 lbs 6 oz 192 lbs 3 oz  BMI 31.97 33.68 31.98  Systolic 130 126 409  Diastolic 88 78 77  Pulse 88 78 68     Physical Exam  Although she remains obese, she has lost weight since the last time I saw her.  Blood pressure acceptable.     Assessment   1. Primary ovarian failure   2. Essential (primary) hypertension   3. Acquired  hypothyroidism   4. Hyperlipidemia, unspecified hyperlipidemia type   5. Uncontrolled diabetes mellitus with complications (HCC)   6. Vitamin D deficiency disease   7. Encounter for hepatitis C screening test for low risk patient       Tests ordered Orders Placed This Encounter  Procedures  . COMPLETE METABOLIC PANEL WITH GFR  . Estradiol  . Hemoglobin A1c  . Lipid panel  . T3, free  . TSH  . Progesterone  . VITAMIN D 25 Hydroxy (Vit-D Deficiency, Fractures)  .  Hepatitis C antibody     Plan: 1. She will continue with estradiol and progesterone and we will check levels. 2. She will continue with lisinopril for hypertension and I will check renal function. 3. She will continue with NP thyroid 90 mg daily and she will come back to get thyroid function test done. 4. Continue with glipizide for diabetes and we will check an A1c.  Hopefully it is low enough that she can discontinue all medications and focus on controlling her diabetes with diet alone. 5. Continue with vitamin D3 10,000 units daily and we will check vitamin D levels. 6. Further recommendations will depend on blood results and I will see her in 3 months time for follow-up.   No orders of the defined types were placed in this encounter.   Wilson Singer, MD

## 2020-08-28 ENCOUNTER — Other Ambulatory Visit (INDEPENDENT_AMBULATORY_CARE_PROVIDER_SITE_OTHER): Payer: Medicare Other

## 2020-08-28 DIAGNOSIS — E559 Vitamin D deficiency, unspecified: Secondary | ICD-10-CM | POA: Diagnosis not present

## 2020-08-28 DIAGNOSIS — E039 Hypothyroidism, unspecified: Secondary | ICD-10-CM | POA: Diagnosis not present

## 2020-08-28 DIAGNOSIS — I10 Essential (primary) hypertension: Secondary | ICD-10-CM | POA: Diagnosis not present

## 2020-08-28 DIAGNOSIS — E1165 Type 2 diabetes mellitus with hyperglycemia: Secondary | ICD-10-CM | POA: Diagnosis not present

## 2020-08-28 DIAGNOSIS — Z1159 Encounter for screening for other viral diseases: Secondary | ICD-10-CM | POA: Diagnosis not present

## 2020-08-28 DIAGNOSIS — E2839 Other primary ovarian failure: Secondary | ICD-10-CM | POA: Diagnosis not present

## 2020-08-28 DIAGNOSIS — E118 Type 2 diabetes mellitus with unspecified complications: Secondary | ICD-10-CM | POA: Diagnosis not present

## 2020-08-28 DIAGNOSIS — E785 Hyperlipidemia, unspecified: Secondary | ICD-10-CM | POA: Diagnosis not present

## 2020-08-29 DIAGNOSIS — H02831 Dermatochalasis of right upper eyelid: Secondary | ICD-10-CM | POA: Diagnosis not present

## 2020-08-29 DIAGNOSIS — H04551 Acquired stenosis of right nasolacrimal duct: Secondary | ICD-10-CM | POA: Diagnosis not present

## 2020-08-29 DIAGNOSIS — H57813 Brow ptosis, bilateral: Secondary | ICD-10-CM | POA: Diagnosis not present

## 2020-08-29 DIAGNOSIS — H02413 Mechanical ptosis of bilateral eyelids: Secondary | ICD-10-CM | POA: Diagnosis not present

## 2020-08-29 DIAGNOSIS — H04223 Epiphora due to insufficient drainage, bilateral lacrimal glands: Secondary | ICD-10-CM | POA: Diagnosis not present

## 2020-08-29 DIAGNOSIS — H02834 Dermatochalasis of left upper eyelid: Secondary | ICD-10-CM | POA: Diagnosis not present

## 2020-08-29 LAB — COMPLETE METABOLIC PANEL WITH GFR
AG Ratio: 1.8 (calc) (ref 1.0–2.5)
ALT: 19 U/L (ref 6–29)
AST: 19 U/L (ref 10–35)
Albumin: 4.1 g/dL (ref 3.6–5.1)
Alkaline phosphatase (APISO): 67 U/L (ref 37–153)
BUN: 22 mg/dL (ref 7–25)
CO2: 23 mmol/L (ref 20–32)
Calcium: 9.5 mg/dL (ref 8.6–10.4)
Chloride: 109 mmol/L (ref 98–110)
Creat: 0.85 mg/dL (ref 0.60–0.93)
GFR, Est African American: 80 mL/min/{1.73_m2} (ref >=60)
GFR, Est Non African American: 69 mL/min/{1.73_m2} (ref >=60)
Globulin: 2.3 g/dL (calc) (ref 1.9–3.7)
Glucose, Bld: 69 mg/dL (ref 65–99)
Potassium: 4.1 mmol/L (ref 3.5–5.3)
Sodium: 140 mmol/L (ref 135–146)
Total Bilirubin: 0.6 mg/dL (ref 0.2–1.2)
Total Protein: 6.4 g/dL (ref 6.1–8.1)

## 2020-08-29 LAB — PROGESTERONE: Progesterone: 22.9 ng/mL

## 2020-08-29 LAB — LIPID PANEL
Cholesterol: 171 mg/dL (ref <200)
HDL: 43 mg/dL — ABNORMAL LOW (ref >=50)
LDL Cholesterol (Calc): 100 mg/dL (calc) — ABNORMAL HIGH
Non-HDL Cholesterol (Calc): 128 mg/dL (calc) (ref <130)
Total CHOL/HDL Ratio: 4 (calc) (ref <5.0)
Triglycerides: 184 mg/dL — ABNORMAL HIGH (ref <150)

## 2020-08-29 LAB — HEMOGLOBIN A1C
Hgb A1c MFr Bld: 5.5 % of total Hgb (ref <5.7)
Mean Plasma Glucose: 111 mg/dL
eAG (mmol/L): 6.2 mmol/L

## 2020-08-29 LAB — HEPATITIS C ANTIBODY
Hepatitis C Ab: NONREACTIVE
SIGNAL TO CUT-OFF: 0.01 (ref <1.00)

## 2020-08-29 LAB — T3, FREE: T3, Free: 3.5 pg/mL (ref 2.3–4.2)

## 2020-08-29 LAB — VITAMIN D 25 HYDROXY (VIT D DEFICIENCY, FRACTURES): Vit D, 25-Hydroxy: 64 ng/mL (ref 30–100)

## 2020-08-29 LAB — ESTRADIOL: Estradiol: 42 pg/mL

## 2020-08-29 LAB — TSH: TSH: 0.66 mIU/L (ref 0.40–4.50)

## 2020-09-09 ENCOUNTER — Encounter (INDEPENDENT_AMBULATORY_CARE_PROVIDER_SITE_OTHER): Payer: Self-pay | Admitting: Internal Medicine

## 2020-09-16 ENCOUNTER — Other Ambulatory Visit (INDEPENDENT_AMBULATORY_CARE_PROVIDER_SITE_OTHER): Payer: Self-pay | Admitting: Internal Medicine

## 2020-09-16 DIAGNOSIS — I119 Hypertensive heart disease without heart failure: Secondary | ICD-10-CM

## 2020-09-30 ENCOUNTER — Ambulatory Visit
Admission: EM | Admit: 2020-09-30 | Discharge: 2020-09-30 | Disposition: A | Discharge status: Home / Self Care | Payer: Medicare Other | Attending: Emergency Medicine | Admitting: Emergency Medicine

## 2020-09-30 ENCOUNTER — Other Ambulatory Visit: Payer: Self-pay

## 2020-09-30 ENCOUNTER — Encounter: Payer: Self-pay | Admitting: Emergency Medicine

## 2020-09-30 DIAGNOSIS — N3001 Acute cystitis with hematuria: Secondary | ICD-10-CM | POA: Insufficient documentation

## 2020-09-30 LAB — POCT URINALYSIS DIP (MANUAL ENTRY)
Glucose, UA: NEGATIVE mg/dL
Ketones, POC UA: NEGATIVE mg/dL
Nitrite, UA: POSITIVE — AB
Protein Ur, POC: >=300 mg/dL — AB
Spec Grav, UA: >=1.03 — AB (ref 1.010–1.025)
Urobilinogen, UA: 1 E.U./dL
pH, UA: 5.5 (ref 5.0–8.0)

## 2020-09-30 MED ORDER — NITROFURANTOIN MONOHYD MACRO 100 MG PO CAPS: 100.0000 mg | ORAL_CAPSULE | Freq: Two times a day (BID) | ORAL | 0 refills | Status: DC

## 2020-09-30 NOTE — ED Provider Notes (Addendum)
[facility]   Chief Complaint  Patient presents with  . Hematuria     SUBJECTIVE:  Meagan Molina is a 70 y.o. female   who presents to the urgent care with a complaint of hematuria that she noticed this morning, lower back pain and lower abdominal pain for the past few days.  Denies any precipitating event.  Localizes the pain to the lower abdomen and lower back.  Pain is intermittent and described as achy in character.  Has tried OTC medications without relief.  Symptoms are made worse with urination.  Denies similar symptoms in the past.  Denies fever, chills, nausea, vomiting, abdominal pain, flank pain, abnormal vaginal discharge or bleeding, hematuria.    LMP: No LMP recorded. Patient has had a hysterectomy.  ROS: As in HPI.  All other pertinent ROS negative.     Past Medical History:  Diagnosis Date  . Allergic rhinitis, unspecified   . Anxiety disorder, unspecified   . Atherosclerotic heart disease of native coronary artery without angina pectoris   . Cataract   . Coronary artery disease    cardiac catheterization 03/01/12, 11/14/03, 10/10/2003, 02/21/1999, 08/14/1985  . Diabetes mellitus ([place])    non-insulin dependent  . Essential (primary) hypertension   . Fibromyalgia   . Fibromyalgia   . Hyperlipidemia, mixed   . Hyperlipidemia, unspecified   . Hypertension   . Hypertensive heart disease without heart failure   . Hypothyroidism   . Hypothyroidism, unspecified   . Major depressive disorder, single episode, moderate ([place])   . Other chest pain   . Other muscle spasm   . Primary insomnia   . Shortness of breath    2D Echocardiogram 02/03/12 with EF of greater than 55%  . Shortness of breath   . Type 2 diabetes mellitus with hyperglycemia ([place])   . Type 2 diabetes mellitus with hyperglycemia [facility])    Past Surgical History:  Procedure Laterality Date  . ABDOMINAL HYSTERECTOMY     TAH&BSO Benign mass 1987  . BREAST BIOPSY    . BREAST SURGERY    .  CARDIAC CATHETERIZATION  11/14/2003, 10/10/2003   2.25x75mm cutting balloon performed in mid left anterior descending, stenosis reduced from 80% to 0% with TIMI-3 to TIMI-3 flow maintained.   On 10/10/03 percutaneous angioplasty and stenting of proximal circumflex coronary artery with 3.5x77mm Cypher stent, post dilated with 3.5x20mm PowerSail at 16 atmospheric pressure maximum, stenosis reduced  from 99% to 0% with TIMI-2 to 3 flow maintained   . EYE SURGERY    . LEFT HEART CATHETERIZATION WITH CORONARY ANGIOGRAM N/A 03/01/2012   Procedure: LEFT HEART CATHETERIZATION WITH CORONARY ANGIOGRAM;  Surgeon: Lorretta Harp, MD;  Location: [facility] CATH LAB;  Service: Cardiovascular;  Laterality: N/A;  . salpingo-oophorectomy comp/prtl uni/bi spx     Allergies  Allergen Reactions  . Sulfa Antibiotics Other (See Comments)    unknown  . Sulfasalazine Nausea And Vomiting    unknown  . Other Nausea Only    Several diabetes medications stopped due to nausea, including Tradjenta, Invokana, Glyxambi, Victoza.  . Sulfamethoxazole Rash   No current facility-administered medications on file prior to encounter.   Current Outpatient Medications on File Prior to Encounter  Medication Sig Dispense Refill  . ACCU-CHEK AVIVA PLUS test strip 1 each by Other route daily. E11.65 100 each 2  . aspirin EC 81 MG tablet Take 81 mg by mouth daily.    . carvedilol (COREG) 12.5 MG tablet Take 1 tablet (12.5 mg  total) by mouth 2 (two) times daily with a meal. 180 tablet 1  . Cholecalciferol (VITAMIN D3) 125 MCG (5000 UT) CHEW Chew 2 tablets by mouth daily.    . clobetasol (TEMOVATE) 0.05 % external solution Apply 1 application topically 2 (two) times daily as needed. Itchy scalp    . clopidogrel (PLAVIX) 75 MG tablet Take 1 tablet (75 mg total) by mouth daily. 90 tablet 1  . estradiol (ESTRACE) 1 MG tablet Take 1 tablet (1 mg total) by mouth daily. 30 tablet 3  . glipiZIDE (GLUCOTROL XL) 5 MG 24 hr tablet Take 1 tablet (5 mg  total) by mouth at bedtime. 30 tablet 3  . glucose blood (ACCU-CHEK AVIVA PLUS) test strip Use to test blood sugar once a day (Dx:  E11.65)    . glucose blood test strip Accu-Chek Aviva Plus test strips  USE 1 STRIP TO CHECK GLUCOSE ONCE DAILY    . lisinopril (ZESTRIL) 5 MG tablet Take 1 tablet by mouth once daily 90 tablet 0  . NP THYROID 90 MG tablet Take 1 tablet (90 mg total) by mouth daily. 30 tablet 3  . progesterone (PROMETRIUM) 200 MG capsule Take 1 capsule by mouth once daily 30 capsule 3  . simvastatin (ZOCOR) 40 MG tablet Take 1 tablet (40 mg total) by mouth every evening. 90 tablet 1   Social History   Socioeconomic History  . Marital status: Widowed    Spouse name: Not on file  . Number of children: Not on file  . Years of education: Not on file  . Highest education level: Not on file  Occupational History  . Not on file  Tobacco Use  . Smoking status: Never Smoker  . Smokeless tobacco: Never Used  Substance and Sexual Activity  . Alcohol use: No  . Drug use: No  . Sexual activity: Not Currently  Other Topics Concern  . Not on file  Social History Narrative   Widow since last 83 years.Lives alone.   Social Determinants of Health   Financial Resource Strain: Not on file  Food Insecurity: Not on file  Transportation Needs: Not on file  Physical Activity: Not on file  Stress: Not on file  Social Connections: Not on file  Intimate Partner Violence: Not on file   Family History  Problem Relation Age of Onset  . Cancer Mother        breast cancer deceased in her 30's  . Coronary artery disease Father        myocardial infarction 09/2003  . Pneumonia Brother     OBJECTIVE:  Vitals:   09/30/20 0815  BP: (!) 150/75  Pulse: 86  Resp: 18  Temp: 97.7 F (36.5 C)  TempSrc: Oral  SpO2: 98%   General appearance: AOx3 in no acute distress HEENT: NCAT.  Oropharynx clear.  Lungs: clear to auscultation bilaterally without adventitious breath sounds Heart:  regular rate and rhythm.  Radial pulses 2+ symmetrical bilaterally Abdomen: soft; non-distended; no tenderness; bowel sounds present; no guarding or rebound tenderness Back: no CVA tenderness Extremities: no edema; symmetrical with no gross deformities Skin: warm and dry Neurologic: Ambulates from chair to exam table without difficulty Psychological: alert and cooperative; normal mood and affect  Labs Reviewed  POCT URINALYSIS DIP (MANUAL ENTRY) - Abnormal; Notable for the following components:      Result Value   Color, UA red (*)    Clarity, UA turbid (*)    Bilirubin, UA small (*)  Spec Grav, UA >=1.030 (*)    Blood, UA large (*)    Protein Ur, POC >=300 (*)    Nitrite, UA Positive (*)    Leukocytes, UA Small (1+) (*)    All other components within normal limits    ASSESSMENT & PLAN:  1. Acute cystitis with hematuria     Meds ordered this encounter  Medications  . nitrofurantoin, macrocrystal-monohydrate, (MACROBID) 100 MG capsule    Sig: Take 1 capsule (100 mg total) by mouth 2 (two) times daily.    Dispense:  10 capsule    Refill:  0   Discharge instructions  Urine culture sent.  We will call you with the results.   Push fluids and get plenty of rest.   Take antibiotic as directed and to completion Follow up with PCP if symptoms persists Return here or go to ER if you have any new or worsening symptoms such as fever, worsening abdominal pain, nausea/vomiting, flank pain, etc...  Outlined signs and symptoms indicating need for more acute intervention. Patient verbalized understanding. After Visit Summary given.     Emerson Monte, FNP 09/30/20 [place], [place], [place] 09/30/20 [phone]

## 2020-09-30 NOTE — ED Triage Notes (Signed)
Noticed blood in urine this morning.  Lower back pain yesterday, more on right side.  Today pain around lower abd.

## 2020-09-30 NOTE — Discharge Instructions (Signed)
Urine culture sent.  We will call you with the results.   Push fluids and get plenty of rest.   Take antibiotic as directed and to completion Follow up with PCP if symptoms persists Return here or go to ER if you have any new or worsening symptoms such as fever, worsening abdominal pain, nausea/vomiting, flank pain, etc... 

## 2020-10-03 LAB — URINE CULTURE: Culture: >=100000 — AB

## 2020-10-07 ENCOUNTER — Other Ambulatory Visit (INDEPENDENT_AMBULATORY_CARE_PROVIDER_SITE_OTHER): Payer: Self-pay | Admitting: Internal Medicine

## 2020-10-07 ENCOUNTER — Encounter (INDEPENDENT_AMBULATORY_CARE_PROVIDER_SITE_OTHER): Payer: Self-pay | Admitting: Internal Medicine

## 2020-10-07 DIAGNOSIS — I119 Hypertensive heart disease without heart failure: Secondary | ICD-10-CM

## 2020-10-07 MED ORDER — CLOPIDOGREL BISULFATE 75 MG PO TABS: 75.0000 mg | ORAL_TABLET | Freq: Every day | ORAL | 1 refills | Status: DC

## 2020-10-23 ENCOUNTER — Other Ambulatory Visit (INDEPENDENT_AMBULATORY_CARE_PROVIDER_SITE_OTHER): Payer: Self-pay | Admitting: Internal Medicine

## 2020-10-23 DIAGNOSIS — E785 Hyperlipidemia, unspecified: Secondary | ICD-10-CM

## 2020-11-25 ENCOUNTER — Other Ambulatory Visit (INDEPENDENT_AMBULATORY_CARE_PROVIDER_SITE_OTHER): Payer: Self-pay | Admitting: Internal Medicine

## 2020-11-26 ENCOUNTER — Ambulatory Visit (INDEPENDENT_AMBULATORY_CARE_PROVIDER_SITE_OTHER): Payer: Medicare Other | Admitting: Internal Medicine

## 2020-11-27 ENCOUNTER — Other Ambulatory Visit (INDEPENDENT_AMBULATORY_CARE_PROVIDER_SITE_OTHER): Payer: Self-pay | Admitting: Internal Medicine

## 2020-11-27 ENCOUNTER — Encounter (INDEPENDENT_AMBULATORY_CARE_PROVIDER_SITE_OTHER): Payer: Self-pay | Admitting: Internal Medicine

## 2020-11-27 ENCOUNTER — Ambulatory Visit (INDEPENDENT_AMBULATORY_CARE_PROVIDER_SITE_OTHER): Payer: Medicare Other | Admitting: Internal Medicine

## 2020-11-27 ENCOUNTER — Other Ambulatory Visit: Payer: Self-pay

## 2020-11-27 VITALS — BP 142/86 | HR 83 | Temp 96.9°F | Ht 65.0 in | Wt 190.6 lb

## 2020-11-27 DIAGNOSIS — E039 Hypothyroidism, unspecified: Secondary | ICD-10-CM | POA: Diagnosis not present

## 2020-11-27 DIAGNOSIS — E1159 Type 2 diabetes mellitus with other circulatory complications: Secondary | ICD-10-CM | POA: Diagnosis not present

## 2020-11-27 DIAGNOSIS — I10 Essential (primary) hypertension: Secondary | ICD-10-CM

## 2020-11-27 DIAGNOSIS — E1169 Type 2 diabetes mellitus with other specified complication: Secondary | ICD-10-CM | POA: Diagnosis not present

## 2020-11-27 DIAGNOSIS — I152 Hypertension secondary to endocrine disorders: Secondary | ICD-10-CM | POA: Diagnosis not present

## 2020-11-27 DIAGNOSIS — E669 Obesity, unspecified: Secondary | ICD-10-CM | POA: Diagnosis not present

## 2020-11-27 DIAGNOSIS — N390 Urinary tract infection, site not specified: Secondary | ICD-10-CM | POA: Diagnosis not present

## 2020-11-27 DIAGNOSIS — E785 Hyperlipidemia, unspecified: Secondary | ICD-10-CM | POA: Diagnosis not present

## 2020-11-27 MED ORDER — CLOBETASOL PROPIONATE 0.05 % EX SOLN: 1.0000 "application " | Freq: Two times a day (BID) | CUTANEOUS | 3 refills | Status: AC | PRN

## 2020-11-27 MED ORDER — MECLIZINE HCL 12.5 MG PO TABS: 12.5000 mg | ORAL_TABLET | Freq: Three times a day (TID) | ORAL | 0 refills | Status: AC | PRN

## 2020-11-27 NOTE — Progress Notes (Signed)
Metrics: Intervention Frequency ACO  Documented Smoking Status Yearly  Screened one or more times in 24 months  Cessation Counseling or  Active cessation medication Past 24 months  Past 24 months   Guideline developer: UpToDate (See UpToDate for funding source) Date Released: 2014       Wellness Office Visit  Subjective:  Patient ID: Meagan Molina, female    DOB: June 10, 1951  Age: 70 y.o. MRN: [id number]  CC: This lady comes in for follow-up of diabetes, hypertension, dyslipidemia, hypothyroidism. HPI  She had a urinary tract infection in March of this year and took antibiotics.  She now has right loin pain again and wonders if the urine infection is gone low back again. She decided to stop taking the estradiol and progesterone as she was worried about breast cancer which seems to have been in her family.  I explained to her that there is no good study that shows estradiol and progesterone in particular increased risk of breast cancer but she has decided to discontinue these. She also continues on statin therapy. She continues on NP thyroid for her hypothyroidism. Past Medical History:  Diagnosis Date  . Allergic rhinitis, unspecified   . Anxiety disorder, unspecified   . Atherosclerotic heart disease of native coronary artery without angina pectoris   . Cataract   . Coronary artery disease    cardiac catheterization 03/01/12, 11/14/03, 10/10/2003, 02/21/1999, 08/14/1985  . Diabetes mellitus (H[facility])    non-insulin dependent  . Essential (primary) hypertension   . Fibromyalgia   . Fibromyalgia   . Hyperlipidemia, mixed   . Hyperlipidemia, unspecified   . Hypertension   . Hypertensive heart disease without heart failure   . Hypothyroidism   . Hypothyroidism, unspecified   . Major depressive disorder, single episode, moderate (H[facility])   . Other chest pain   . Other muscle spasm   . Primary insomnia   . Shortness of breath    2D Echocardiogram 02/03/12 with EF of greater than 55%  .  Shortness of breath   . Type 2 diabetes mellitus with hyperglycemia ([facility])   . Type 2 diabetes mellitus with hyperglycemia [facility])    Past Surgical History:  Procedure Laterality Date  . ABDOMINAL HYSTERECTOMY     TAH&BSO Benign mass 1987  . BREAST BIOPSY    . BREAST SURGERY    . CARDIAC CATHETERIZATION  11/14/2003, 10/10/2003   2.25x79mm cutting balloon performed in mid left anterior descending, stenosis reduced from 80% to 0% with TIMI-3 to TIMI-3 flow maintained.   On 10/10/03 percutaneous angioplasty and stenting of proximal circumflex coronary artery with 3.5x36mm Cypher stent, post dilated with 3.5x67mm PowerSail at 16 atmospheric pressure maximum, stenosis reduced  from 99% to 0% with TIMI-2 to 3 flow maintained   . EYE SURGERY    . LEFT HEART CATHETERIZATION WITH CORONARY ANGIOGRAM N/A 03/01/2012   Procedure: LEFT HEART CATHETERIZATION WITH CORONARY ANGIOGRAM;  Surgeon: Runell Gess, MD;  Location: [facility] CATH LAB;  Service: Cardiovascular;  Laterality: N/A;  . salpingo-oophorectomy comp/prtl uni/bi spx       Family History  Problem Relation Age of Onset  . Cancer Mother        breast cancer deceased in her 40's  . Coronary artery disease Father        myocardial infarction 09/2003  . Pneumonia Brother     Social History   Social History Narrative   Widow since last 15 years.Lives alone.   Social History   Tobacco Use  .  Smoking status: Never Smoker  . Smokeless tobacco: Never Used  Substance Use Topics  . Alcohol use: No    Current Meds  Medication Sig  . ACCU-CHEK AVIVA PLUS test strip 1 each by Other route daily. E11.65  . aspirin EC 81 MG tablet Take 81 mg by mouth daily.  . carvedilol (COREG) 12.5 MG tablet Take 1 tablet (12.5 mg total) by mouth 2 (two) times daily with a meal.  . Cholecalciferol (VITAMIN D3) 125 MCG (5000 UT) CHEW Chew 2 tablets by mouth daily.  . clobetasol (TEMOVATE) 0.05 % external solution Apply 1 application topically 2 (two) times daily as  needed. Itchy scalp  . clopidogrel (PLAVIX) 75 MG tablet Take 1 tablet (75 mg total) by mouth daily.  . Cobalamin Combinations (B-12) [phone] MCG SUBL Place 1,000 mg under the tongue daily.  Marland Kitchen glucose blood (ACCU-CHEK AVIVA PLUS) test strip Use to test blood sugar once a day (Dx:  E11.65)  . glucose blood test strip Accu-Chek Aviva Plus test strips  USE 1 STRIP TO CHECK GLUCOSE ONCE DAILY  . lisinopril (ZESTRIL) 5 MG tablet Take 1 tablet by mouth once daily  . NP THYROID 90 MG tablet Take 1 tablet by mouth once daily  . simvastatin (ZOCOR) 40 MG tablet TAKE 1 TABLET BY MOUTH ONCE DAILY IN THE EVENING     Flowsheet Row Office Visit from 11/27/2020 in [facility]  PHQ-9 Total Score 0      Objective:   Today's Vitals: BP (!) 142/86   Pulse 83   Temp (!) 96.9 F (36.1 C) (Temporal)   Ht 5\' 5"  (1.651 m)   Wt 190 lb 9.6 oz (86.5 kg)   SpO2 98%   BMI 31.72 kg/m  Vitals with BMI 11/27/2020 09/30/2020 08/27/2020  Height 5\' 5"  - 5\' 6"   Weight 190 lbs 10 oz - 198 lbs  BMI 31.72 - 31.97  Systolic 142 150 604  Diastolic 86 75 88  Pulse 83 86 88     Physical Exam  She remains obese.  She has lost 8 pounds since last visit.  Blood pressure somewhat elevated.     Assessment   1. Essential (primary) hypertension   2. Acquired hypothyroidism   3. Hyperlipidemia, unspecified hyperlipidemia type   4. Obesity, diabetes, and hypertension syndrome (HCC)   5. Urinary tract infection without hematuria, site unspecified       Tests ordered Orders Placed This Encounter  Procedures  . COMPLETE METABOLIC PANEL WITH GFR  . Hemoglobin A1c  . Lipid panel  . T3, free  . TSH  . Urinalysis w microscopic + reflex cultur     Plan: 1. Continue with same antihypertensive medications. 2. Continue with NP thyroid and we will check thyroid function today. 3. Repeat urinalysis and possible culture to see if she has UTI. 4. I informed her of the increased risk of breast cancer with  statin therapy also. 5. Further recommendations will depend on results and I will see her in 3 months time for follow-up.   No orders of the defined types were placed in this encounter.   Wilson Singer, MD

## 2020-11-28 LAB — LIPID PANEL
Cholesterol: 144 mg/dL (ref <200)
HDL: 46 mg/dL — ABNORMAL LOW (ref >=50)
LDL Cholesterol (Calc): 71 mg/dL (calc)
Non-HDL Cholesterol (Calc): 98 mg/dL (calc) (ref <130)
Total CHOL/HDL Ratio: 3.1 (calc) (ref <5.0)
Triglycerides: 204 mg/dL — ABNORMAL HIGH (ref <150)

## 2020-11-28 LAB — URINALYSIS W MICROSCOPIC + REFLEX CULTURE
Bacteria, UA: NONE SEEN /HPF
Bilirubin Urine: NEGATIVE
Glucose, UA: NEGATIVE
Hgb urine dipstick: NEGATIVE
Hyaline Cast: NONE SEEN /LPF
Ketones, ur: NEGATIVE
Leukocyte Esterase: NEGATIVE
Nitrites, Initial: NEGATIVE
Protein, ur: NEGATIVE
RBC / HPF: NONE SEEN /HPF (ref 0–2)
Specific Gravity, Urine: 1.004 (ref 1.001–1.035)
WBC, UA: NONE SEEN /HPF (ref 0–5)
pH: 6.5 (ref 5.0–8.0)

## 2020-11-28 LAB — COMPLETE METABOLIC PANEL WITH GFR
AG Ratio: 1.6 (calc) (ref 1.0–2.5)
ALT: 22 U/L (ref 6–29)
AST: 18 U/L (ref 10–35)
Albumin: 4.3 g/dL (ref 3.6–5.1)
Alkaline phosphatase (APISO): 85 U/L (ref 37–153)
BUN: 15 mg/dL (ref 7–25)
CO2: 26 mmol/L (ref 20–32)
Calcium: 9.7 mg/dL (ref 8.6–10.4)
Chloride: 104 mmol/L (ref 98–110)
Creat: 0.81 mg/dL (ref 0.60–0.93)
GFR, Est African American: 85 mL/min/{1.73_m2} (ref >=60)
GFR, Est Non African American: 73 mL/min/{1.73_m2} (ref >=60)
Globulin: 2.7 g/dL (calc) (ref 1.9–3.7)
Glucose, Bld: 148 mg/dL — ABNORMAL HIGH (ref 65–99)
Potassium: 4.4 mmol/L (ref 3.5–5.3)
Sodium: 140 mmol/L (ref 135–146)
Total Bilirubin: 1.1 mg/dL (ref 0.2–1.2)
Total Protein: 7 g/dL (ref 6.1–8.1)

## 2020-11-28 LAB — TSH: TSH: 0.26 mIU/L — ABNORMAL LOW (ref 0.40–4.50)

## 2020-11-28 LAB — HEMOGLOBIN A1C
Hgb A1c MFr Bld: 6.5 % of total Hgb — ABNORMAL HIGH (ref <5.7)
Mean Plasma Glucose: 140 mg/dL
eAG (mmol/L): 7.7 mmol/L

## 2020-11-28 LAB — NO CULTURE INDICATED

## 2020-11-28 LAB — T3, FREE: T3, Free: 5.4 pg/mL — ABNORMAL HIGH (ref 2.3–4.2)

## 2020-12-15 ENCOUNTER — Other Ambulatory Visit (INDEPENDENT_AMBULATORY_CARE_PROVIDER_SITE_OTHER): Payer: Self-pay | Admitting: Internal Medicine

## 2020-12-15 DIAGNOSIS — I119 Hypertensive heart disease without heart failure: Secondary | ICD-10-CM

## 2020-12-20 ENCOUNTER — Encounter (INDEPENDENT_AMBULATORY_CARE_PROVIDER_SITE_OTHER): Payer: Self-pay | Admitting: Internal Medicine

## 2020-12-20 MED ORDER — GLIPIZIDE ER 5 MG PO TB24: 5.0000 mg | ORAL_TABLET | Freq: Every day | ORAL | 0 refills | Status: DC

## 2020-12-31 ENCOUNTER — Other Ambulatory Visit (INDEPENDENT_AMBULATORY_CARE_PROVIDER_SITE_OTHER): Payer: Self-pay | Admitting: Internal Medicine

## 2021-01-07 ENCOUNTER — Other Ambulatory Visit: Payer: Self-pay

## 2021-01-07 ENCOUNTER — Encounter (INDEPENDENT_AMBULATORY_CARE_PROVIDER_SITE_OTHER): Payer: Self-pay | Admitting: Internal Medicine

## 2021-01-07 ENCOUNTER — Ambulatory Visit (INDEPENDENT_AMBULATORY_CARE_PROVIDER_SITE_OTHER): Payer: Medicare Other | Admitting: Internal Medicine

## 2021-01-07 ENCOUNTER — Other Ambulatory Visit (INDEPENDENT_AMBULATORY_CARE_PROVIDER_SITE_OTHER): Payer: Self-pay | Admitting: Internal Medicine

## 2021-01-07 VITALS — BP 128/86 | HR 85 | Temp 96.1°F | Ht 65.0 in | Wt 183.6 lb

## 2021-01-07 DIAGNOSIS — E1165 Type 2 diabetes mellitus with hyperglycemia: Secondary | ICD-10-CM | POA: Diagnosis not present

## 2021-01-07 DIAGNOSIS — E118 Type 2 diabetes mellitus with unspecified complications: Secondary | ICD-10-CM

## 2021-01-07 DIAGNOSIS — IMO0002 Reserved for concepts with insufficient information to code with codable children: Secondary | ICD-10-CM

## 2021-01-07 LAB — BASIC METABOLIC PANEL
BUN: 19 mg/dL (ref 7–25)
CO2: 26 mmol/L (ref 20–32)
Calcium: 9.7 mg/dL (ref 8.6–10.4)
Chloride: 104 mmol/L (ref 98–110)
Creat: 0.9 mg/dL (ref 0.60–0.93)
Glucose, Bld: 274 mg/dL — ABNORMAL HIGH (ref 65–99)
Potassium: 4.7 mmol/L (ref 3.5–5.3)
Sodium: 139 mmol/L (ref 135–146)

## 2021-01-07 MED ORDER — PIOGLITAZONE HCL 45 MG PO TABS: 45.0000 mg | ORAL_TABLET | Freq: Every day | ORAL | 3 refills | Status: DC

## 2021-01-07 NOTE — Progress Notes (Signed)
Metrics: Intervention Frequency ACO  Documented Smoking Status Yearly  Screened one or more times in 24 months  Cessation Counseling or  Active cessation medication Past 24 months  Past 24 months   Guideline developer: UpToDate (See UpToDate for funding source) Date Released: 2014       Wellness Office Visit  Subjective:  Patient ID: Meagan Molina, female    DOB: 1950-09-30  Age: 70 y.o. MRN: [id number]  CC: This lady comes in for an acute visit because her blood glucose levels have been significantly elevated. HPI  She continues on glipizide 5 mg once a day and I have discontinued Actos in the past.  Her hemoglobin A1c last time measured was increasing to 6.5%.  More recently, her blood glucose levels have been significantly elevated, greater than 200 and on 1 occasion almost 400.  She does not feel well overall.  She is trying to drink plenty of water to keep hydrated.  Unfortunately, her insurance plan does not seem to pay for many of the newer diabetic medications.  She cannot remember the full list of medications that her insurance will pay for. Past Medical History:  Diagnosis Date   Allergic rhinitis, unspecified    Anxiety disorder, unspecified    Atherosclerotic heart disease of native coronary artery without angina pectoris    Cataract    Coronary artery disease    cardiac catheterization 03/01/12, 11/14/03, 10/10/2003, 02/21/1999, 08/14/1985   Diabetes mellitus (H[facility])    non-insulin dependent   Essential (primary) hypertension    Fibromyalgia    Fibromyalgia    Hyperlipidemia, mixed    Hyperlipidemia, unspecified    Hypertension    Hypertensive heart disease without heart failure    Hypothyroidism    Hypothyroidism, unspecified    Major depressive disorder, single episode, moderate (HCC)    Other chest pain    Other muscle spasm    Primary insomnia    Shortness of breath    2D Echocardiogram 02/03/12 with EF of greater than 55%   Shortness of breath    Type 2  diabetes mellitus with hyperglycemia (H[facility])    Type 2 diabetes mellitus with hyperglycemia (H[facility])    Past Surgical History:  Procedure Laterality Date   ABDOMINAL HYSTERECTOMY     TAH&BSO Benign mass 1987   BREAST BIOPSY     BREAST SURGERY     CARDIAC CATHETERIZATION  11/14/2003, 10/10/2003   2.25x71mm cutting balloon performed in mid left anterior descending, stenosis reduced from 80% to 0% with TIMI-3 to TIMI-3 flow maintained.   On 10/10/03 percutaneous angioplasty and stenting of proximal circumflex coronary artery with 3.5x43mm Cypher stent, post dilated with 3.5x44mm PowerSail at 16 atmospheric pressure maximum, stenosis reduced  from 99% to 0% with TIMI-2 to 3 flow maintained    EYE SURGERY     LEFT HEART CATHETERIZATION WITH CORONARY ANGIOGRAM N/A 03/01/2012   Procedure: LEFT HEART CATHETERIZATION WITH CORONARY ANGIOGRAM;  Surgeon: Runell Gess, MD;  Location: [facility] CATH LAB;  Service: Cardiovascular;  Laterality: N/A;   salpingo-oophorectomy comp/prtl uni/bi spx       Family History  Problem Relation Age of Onset   Cancer Mother        breast cancer deceased in her 53's   Coronary artery disease Father        myocardial infarction 09/2003   Pneumonia Brother     Social History   Social History Narrative   Widow since last 15 years.Lives alone.   Social History  Tobacco Use   Smoking status: Never   Smokeless tobacco: Never  Substance Use Topics   Alcohol use: No    Current Meds  Medication Sig   ACCU-CHEK AVIVA PLUS test strip 1 each by Other route daily. E11.65   aspirin EC 81 MG tablet Take 81 mg by mouth daily.   carvedilol (COREG) 12.5 MG tablet Take 1 tablet (12.5 mg total) by mouth 2 (two) times daily with a meal.   Cholecalciferol (VITAMIN D3) 125 MCG (5000 UT) CHEW Chew 2 tablets by mouth daily.   clobetasol (TEMOVATE) 0.05 % external solution Apply 1 application topically 2 (two) times daily as needed. Itchy scalp   clopidogrel (PLAVIX) 75 MG tablet Take  1 tablet (75 mg total) by mouth daily.   Cobalamin Combinations (B-12) [phone] MCG SUBL Place 1,000 mg under the tongue daily.   glipiZIDE (GLUCOTROL XL) 5 MG 24 hr tablet Take 1 tablet (5 mg total) by mouth at bedtime.   glucose blood (ACCU-CHEK AVIVA PLUS) test strip Use to test blood sugar once a day (Dx:  E11.65)   glucose blood test strip Accu-Chek Aviva Plus test strips  USE 1 STRIP TO CHECK GLUCOSE ONCE DAILY   lisinopril (ZESTRIL) 5 MG tablet Take 1 tablet by mouth once daily   meclizine (ANTIVERT) 12.5 MG tablet Take 1 tablet (12.5 mg total) by mouth 3 (three) times daily as needed for dizziness.   NP THYROID 90 MG tablet Take 1 tablet (90 mg total) by mouth daily.   simvastatin (ZOCOR) 40 MG tablet TAKE 1 TABLET BY MOUTH ONCE DAILY IN THE EVENING     Flowsheet Row Office Visit from 11/27/2020 in [facility]  PHQ-9 Total Score 0       Objective:   Today's Vitals: BP 128/86   Pulse 85   Temp (!) 96.1 F (35.6 C) (Temporal)   Ht 5\' 5"  (1.651 m)   Wt 183 lb 9.6 oz (83.3 kg)   SpO2 99%   BMI 30.55 kg/m  Vitals with BMI 01/07/2021 11/27/2020 09/30/2020  Height 5\' 5"  5\' 5"  -  Weight 183 lbs 10 oz 190 lbs 10 oz -  BMI 30.55 31.72 -  Systolic 128 142 376  Diastolic 86 86 75  Pulse 85 83 86     Physical Exam She does not look clinically dehydrated.  She has lost 7 pounds since last visit.  Blood pressure is reasonable and not hypotensive.      Assessment   1. Uncontrolled diabetes mellitus with complications (HCC)       Tests ordered Orders Placed This Encounter  Procedures   Basic metabolic panel      Plan: 1.  I will check electrolytes today to make sure that apart from hyperglycemia there were no other abnormalities.  I have asked her to look at her insurance prescription plan and let me know which medications her insurance will pay for for diabetes and we will go from there. 2.  Further recommendations will depend on blood results.    No  orders of the defined types were placed in this encounter.   Wilson Singer, MD

## 2021-01-29 ENCOUNTER — Other Ambulatory Visit (INDEPENDENT_AMBULATORY_CARE_PROVIDER_SITE_OTHER): Payer: Self-pay | Admitting: Internal Medicine

## 2021-01-29 DIAGNOSIS — E785 Hyperlipidemia, unspecified: Secondary | ICD-10-CM

## 2021-01-31 ENCOUNTER — Other Ambulatory Visit (INDEPENDENT_AMBULATORY_CARE_PROVIDER_SITE_OTHER): Payer: Self-pay | Admitting: Internal Medicine

## 2021-01-31 ENCOUNTER — Encounter (INDEPENDENT_AMBULATORY_CARE_PROVIDER_SITE_OTHER): Payer: Self-pay | Admitting: Internal Medicine

## 2021-01-31 DIAGNOSIS — E785 Hyperlipidemia, unspecified: Secondary | ICD-10-CM

## 2021-02-03 MED ORDER — SIMVASTATIN 40 MG PO TABS: 40.0000 mg | ORAL_TABLET | Freq: Every evening | ORAL | 0 refills | Status: AC

## 2021-03-13 ENCOUNTER — Ambulatory Visit (INDEPENDENT_AMBULATORY_CARE_PROVIDER_SITE_OTHER): Payer: Medicare Other | Admitting: Internal Medicine

## 2021-03-20 ENCOUNTER — Telehealth (INDEPENDENT_AMBULATORY_CARE_PROVIDER_SITE_OTHER): Payer: Self-pay

## 2021-03-20 DIAGNOSIS — E1165 Type 2 diabetes mellitus with hyperglycemia: Secondary | ICD-10-CM

## 2021-03-20 DIAGNOSIS — I119 Hypertensive heart disease without heart failure: Secondary | ICD-10-CM

## 2021-03-20 DIAGNOSIS — IMO0002 Reserved for concepts with insufficient information to code with codable children: Secondary | ICD-10-CM

## 2021-03-20 MED ORDER — GLIPIZIDE ER 5 MG PO TB24: 5.0000 mg | ORAL_TABLET | Freq: Every day | ORAL | 0 refills | Status: DC

## 2021-03-20 MED ORDER — LISINOPRIL 5 MG PO TABS
5.0000 mg | ORAL_TABLET | Freq: Every day | ORAL | 0 refills | Status: DC
Start: 2021-03-20 — End: 2023-09-09

## 2021-03-20 MED ORDER — CLOPIDOGREL BISULFATE 75 MG PO TABS: 75.0000 mg | ORAL_TABLET | Freq: Every day | ORAL | 0 refills | Status: DC

## 2021-03-20 NOTE — Telephone Encounter (Signed)
Received faxes from [place] for the following medication refill requests:  glipiZIDE (GLUCOTROL XL) 5 MG 24 hr tablet  Last filled 12/20/2020, # 90 with 0 refills  lisinopril (ZESTRIL) 5 MG tablet  Last filled 12/16/2020, # 90 with 0 refills  clopidogrel (PLAVIX) 75 MG tablet  Last filled 10/07/2020, # 90 with 1 refill

## 2021-03-28 ENCOUNTER — Ambulatory Visit (INDEPENDENT_AMBULATORY_CARE_PROVIDER_SITE_OTHER): Payer: Medicare Other | Admitting: Cardiology

## 2021-03-28 ENCOUNTER — Encounter: Payer: Self-pay | Admitting: Cardiology

## 2021-03-28 VITALS — BP 150/80 | Ht 65.0 in | Wt 195.6 lb

## 2021-03-28 DIAGNOSIS — I2510 Atherosclerotic heart disease of native coronary artery without angina pectoris: Secondary | ICD-10-CM

## 2021-03-28 DIAGNOSIS — E782 Mixed hyperlipidemia: Secondary | ICD-10-CM | POA: Diagnosis not present

## 2021-03-28 DIAGNOSIS — I10 Essential (primary) hypertension: Secondary | ICD-10-CM

## 2021-03-28 NOTE — Progress Notes (Signed)
Clinical Summary Meagan Molina is a 70 y.o.female seen today for follow up of the following medical problems.   1. CAD - history of posterior MI in April 2005, stents to LCX    2013 cath: LM normal, LAD normal, LCX patent stent, nondom RCA 50% 01/2012 echo LVEF >37%, grade I diastolic dysfunction,     - no chest pain, no sob - compliant with meds. She has favored long term DAPT, from notes has been on since 2005    2. HTN - compliant with meds.   - has some white coat HTN - did not take coreg this AM>   3. Hyperlipidemia - she is on zocor 40mg  for several years.   11/2020 TC 144 HDL 46 TG 204 LDL 71   4. Mitral regurgiation - mild by 01/2012 echo   5. DM2 - followed by enodcrine.      SH: stepson is Beautiful Pensyl, a patient of mine Past Medical History:  Diagnosis Date   Allergic rhinitis, unspecified    Anxiety disorder, unspecified    Atherosclerotic heart disease of native coronary artery without angina pectoris    Cataract    Coronary artery disease    cardiac catheterization 03/01/12, 11/14/03, 10/10/2003, 02/21/1999, 08/14/1985   Diabetes mellitus ([place])    non-insulin dependent   Essential (primary) hypertension    Fibromyalgia    Fibromyalgia    Hyperlipidemia, mixed    Hyperlipidemia, unspecified    Hypertension    Hypertensive heart disease without heart failure    Hypothyroidism    Hypothyroidism, unspecified    Major depressive disorder, single episode, moderate ([place])    Other chest pain    Other muscle spasm    Primary insomnia    Shortness of breath    2D Echocardiogram 02/03/12 with EF of greater than 55%   Shortness of breath    Type 2 diabetes mellitus with hyperglycemia (HCC)    Type 2 diabetes mellitus with hyperglycemia (HCC)      Allergies  Allergen Reactions   Sulfa Antibiotics Other (See Comments)    unknown   Sulfasalazine Nausea And Vomiting    unknown   Other Nausea Only    Several diabetes medications stopped due to  nausea, including Tradjenta, Invokana, Glyxambi, Victoza.   Sulfamethoxazole Rash     Current Outpatient Medications  Medication Sig Dispense Refill   ACCU-CHEK AVIVA PLUS test strip 1 each by Other route daily. E11.65 100 each 2   aspirin EC 81 MG tablet Take 81 mg by mouth daily.     carvedilol (COREG) 12.5 MG tablet Take 1 tablet (12.5 mg total) by mouth 2 (two) times daily with a meal. 180 tablet 1   Cholecalciferol (VITAMIN D3) 125 MCG (5000 UT) CHEW Chew 2 tablets by mouth daily.     clobetasol (TEMOVATE) 0.05 % external solution Apply 1 application topically 2 (two) times daily as needed. Itchy scalp 50 mL 3   clopidogrel (PLAVIX) 75 MG tablet Take 1 tablet (75 mg total) by mouth daily. 90 tablet 0   Cobalamin Combinations (B-12) [phone] MCG SUBL Place 1,000 mg under the tongue daily.     glipiZIDE (GLUCOTROL XL) 5 MG 24 hr tablet Take 1 tablet (5 mg total) by mouth at bedtime. 90 tablet 0   glucose blood (ACCU-CHEK AVIVA PLUS) test strip Use to test blood sugar once a day (Dx:  E11.65)     glucose blood test strip Accu-Chek Aviva Plus test strips  USE  1 STRIP TO CHECK GLUCOSE ONCE DAILY     lisinopril (ZESTRIL) 5 MG tablet Take 1 tablet (5 mg total) by mouth daily. 90 tablet 0   meclizine (ANTIVERT) 12.5 MG tablet Take 1 tablet (12.5 mg total) by mouth 3 (three) times daily as needed for dizziness. 30 tablet 0   NP THYROID 90 MG tablet Take 1 tablet (90 mg total) by mouth daily. 30 tablet 2   pioglitazone (ACTOS) 45 MG tablet Take 1 tablet (45 mg total) by mouth daily. 30 tablet 3   simvastatin (ZOCOR) 40 MG tablet Take 1 tablet (40 mg total) by mouth every evening. 90 tablet 0   No current facility-administered medications for this visit.     Past Surgical History:  Procedure Laterality Date   ABDOMINAL HYSTERECTOMY     TAH&BSO Benign mass 1987   BREAST BIOPSY     BREAST SURGERY     CARDIAC CATHETERIZATION  11/14/2003, 10/10/2003   2.25x24mm cutting balloon performed in  mid left anterior descending, stenosis reduced from 80% to 0% with TIMI-3 to TIMI-3 flow maintained.   On 10/10/03 percutaneous angioplasty and stenting of proximal circumflex coronary artery with 3.5x61mm Cypher stent, post dilated with 3.5x14mm PowerSail at 16 atmospheric pressure maximum, stenosis reduced  from 99% to 0% with TIMI-2 to 3 flow maintained    EYE SURGERY     LEFT HEART CATHETERIZATION WITH CORONARY ANGIOGRAM N/A 03/01/2012   Procedure: LEFT HEART CATHETERIZATION WITH CORONARY ANGIOGRAM;  Surgeon: Lorretta Harp, MD;  Location: [facility] CATH LAB;  Service: Cardiovascular;  Laterality: N/A;   salpingo-oophorectomy comp/prtl uni/bi spx       Allergies  Allergen Reactions   Sulfa Antibiotics Other (See Comments)    unknown   Sulfasalazine Nausea And Vomiting    unknown   Other Nausea Only    Several diabetes medications stopped due to nausea, including Tradjenta, Invokana, Glyxambi, Victoza.   Sulfamethoxazole Rash      Family History  Problem Relation Age of Onset   Cancer Mother        breast cancer deceased in her 34's   Coronary artery disease Father        myocardial infarction 09/2003   Pneumonia Brother      Social History Ms. Whicker reports that she has never smoked. She has never used smokeless tobacco. Ms. Whan reports no history of alcohol use.   Review of Systems CONSTITUTIONAL: No weight loss, fever, chills, weakness or fatigue.  HEENT: Eyes: No visual loss, blurred vision, double vision or yellow sclerae.No hearing loss, sneezing, congestion, runny nose or sore throat.  SKIN: No rash or itching.  CARDIOVASCULAR: per hpi RESPIRATORY: No shortness of breath, cough or sputum.  GASTROINTESTINAL: No anorexia, nausea, vomiting or diarrhea. No abdominal pain or blood.  GENITOURINARY: No burning on urination, no polyuria NEUROLOGICAL: No headache, dizziness, syncope, paralysis, ataxia, numbness or tingling in the extremities. No change in bowel or bladder  control.  MUSCULOSKELETAL: No muscle, back pain, joint pain or stiffness.  LYMPHATICS: No enlarged nodes. No history of splenectomy.  PSYCHIATRIC: No history of depression or anxiety.  ENDOCRINOLOGIC: No reports of sweating, cold or heat intolerance. No polyuria or polydipsia.  Marland Kitchen   Physical Examination Today's Vitals   03/28/21 1442  BP: (!) 150/80  SpO2: 94%  Weight: 195 lb 9.6 oz (88.7 kg)  Height: 5\' 5"  (1.651 m)   Body mass index is 32.55 kg/m.  Gen: resting comfortably, no acute distress HEENT: no scleral icterus, pupils  equal round and reactive, no palptable cervical adenopathy,  CV: RRR, no m/rg, no jvd Resp: Clear to auscultation bilaterally GI: abdomen is soft, non-tender, non-distended, normal bowel sounds, no hepatosplenomegaly MSK: extremities are warm, no edema.  Skin: warm, no rash Neuro:  no focal deficits Psych: appropriate affect   Diagnostic Studies     Assessment and Plan  1. CAD - no symptoms, continue current meds - EKG today SR, no ischemic changes    2. HTN - elevated today, she did not take her AM coreg and reports history of white coat HTN - she will call next week with home bp's. Room to titrate lisinopril if needed   3. Hyperlipidemia Overall at goal, continue statin   F/u 6 months  Arnoldo Lenis, M.D.

## 2021-03-28 NOTE — Patient Instructions (Signed)
Medication Instructions:  Your physician recommends that you continue on your current medications as directed. Please refer to the Current Medication list given to you today.  *If you need a refill on your cardiac medications before your next appointment, please call your pharmacy*   Lab Work: None today  If you have labs (blood work) drawn today and your tests are completely normal, you will receive your results only by: [place] (if you have MyChart) OR A paper copy in the mail If you have any lab test that is abnormal or we need to change your treatment, we will call you to review the results.   Testing/Procedures: None today    Follow-Up: At [facility], you and your health needs are our priority.  As part of our continuing mission to provide you with exceptional heart care, we have created designated Provider Care Teams.  These Care Teams include your primary Cardiologist (physician) and Advanced Practice Providers (APPs -  Physician Assistants and Nurse Practitioners) who all work together to provide you with the care you need, when you need it.  We recommend signing up for the patient portal called "MyChart".  Sign up information is provided on this After Visit Summary.  MyChart is used to connect with patients for Virtual Visits (Telemedicine).  Patients are able to view lab/test results, encounter notes, upcoming appointments, etc.  Non-urgent messages can be sent to your provider as well.   To learn more about what you can do with MyChart, go to [email or web address].    Your next appointment:   6 month(s)  The format for your next appointment:   In Person  Provider:   Carlyle Dolly, MD   Other Instructions  [facility] [phone]

## 2021-04-03 DIAGNOSIS — E039 Hypothyroidism, unspecified: Secondary | ICD-10-CM | POA: Diagnosis not present

## 2021-04-03 DIAGNOSIS — I10 Essential (primary) hypertension: Secondary | ICD-10-CM | POA: Diagnosis not present

## 2021-04-03 DIAGNOSIS — E785 Hyperlipidemia, unspecified: Secondary | ICD-10-CM | POA: Diagnosis not present

## 2021-04-03 DIAGNOSIS — J309 Allergic rhinitis, unspecified: Secondary | ICD-10-CM | POA: Diagnosis not present

## 2021-04-03 DIAGNOSIS — Z1389 Encounter for screening for other disorder: Secondary | ICD-10-CM | POA: Diagnosis not present

## 2021-04-03 DIAGNOSIS — Z1239 Encounter for other screening for malignant neoplasm of breast: Secondary | ICD-10-CM | POA: Diagnosis not present

## 2021-04-03 DIAGNOSIS — E1169 Type 2 diabetes mellitus with other specified complication: Secondary | ICD-10-CM | POA: Diagnosis not present

## 2021-04-03 DIAGNOSIS — I2510 Atherosclerotic heart disease of native coronary artery without angina pectoris: Secondary | ICD-10-CM | POA: Diagnosis not present

## 2021-04-08 DIAGNOSIS — Z1231 Encounter for screening mammogram for malignant neoplasm of breast: Secondary | ICD-10-CM | POA: Diagnosis not present

## 2021-06-10 DIAGNOSIS — E1169 Type 2 diabetes mellitus with other specified complication: Secondary | ICD-10-CM | POA: Diagnosis not present

## 2021-06-10 DIAGNOSIS — Z7984 Long term (current) use of oral hypoglycemic drugs: Secondary | ICD-10-CM | POA: Diagnosis not present

## 2021-06-10 DIAGNOSIS — J309 Allergic rhinitis, unspecified: Secondary | ICD-10-CM | POA: Diagnosis not present

## 2021-06-10 DIAGNOSIS — E039 Hypothyroidism, unspecified: Secondary | ICD-10-CM | POA: Diagnosis not present

## 2021-06-10 DIAGNOSIS — E785 Hyperlipidemia, unspecified: Secondary | ICD-10-CM | POA: Diagnosis not present

## 2021-06-10 DIAGNOSIS — Z0000 Encounter for general adult medical examination without abnormal findings: Secondary | ICD-10-CM | POA: Diagnosis not present

## 2021-06-10 DIAGNOSIS — I10 Essential (primary) hypertension: Secondary | ICD-10-CM | POA: Diagnosis not present

## 2021-06-10 DIAGNOSIS — I2510 Atherosclerotic heart disease of native coronary artery without angina pectoris: Secondary | ICD-10-CM | POA: Diagnosis not present

## 2021-06-10 DIAGNOSIS — Z1389 Encounter for screening for other disorder: Secondary | ICD-10-CM | POA: Diagnosis not present

## 2021-07-03 DIAGNOSIS — Z23 Encounter for immunization: Secondary | ICD-10-CM | POA: Diagnosis not present

## 2021-10-06 DIAGNOSIS — E6609 Other obesity due to excess calories: Secondary | ICD-10-CM | POA: Diagnosis not present

## 2021-10-06 DIAGNOSIS — E538 Deficiency of other specified B group vitamins: Secondary | ICD-10-CM | POA: Diagnosis not present

## 2021-10-06 DIAGNOSIS — E1165 Type 2 diabetes mellitus with hyperglycemia: Secondary | ICD-10-CM | POA: Diagnosis not present

## 2021-10-06 DIAGNOSIS — E039 Hypothyroidism, unspecified: Secondary | ICD-10-CM | POA: Diagnosis not present

## 2021-10-06 DIAGNOSIS — E782 Mixed hyperlipidemia: Secondary | ICD-10-CM | POA: Diagnosis not present

## 2021-10-06 DIAGNOSIS — Z1211 Encounter for screening for malignant neoplasm of colon: Secondary | ICD-10-CM | POA: Diagnosis not present

## 2021-10-06 DIAGNOSIS — I10 Essential (primary) hypertension: Secondary | ICD-10-CM | POA: Diagnosis not present

## 2021-10-06 DIAGNOSIS — Z1212 Encounter for screening for malignant neoplasm of rectum: Secondary | ICD-10-CM | POA: Diagnosis not present

## 2021-10-06 DIAGNOSIS — Z6834 Body mass index (BMI) 34.0-34.9, adult: Secondary | ICD-10-CM | POA: Diagnosis not present

## 2021-12-10 ENCOUNTER — Ambulatory Visit (HOSPITAL_COMMUNITY)
Admission: RE | Admit: 2021-12-10 | Discharge: 2021-12-10 | Disposition: A | Discharge status: Home / Self Care | Payer: Medicare Other | Source: Ambulatory Visit | Attending: Adult Health Nurse Practitioner | Admitting: Adult Health Nurse Practitioner

## 2021-12-10 ENCOUNTER — Other Ambulatory Visit (HOSPITAL_COMMUNITY): Payer: Self-pay | Admitting: Adult Health Nurse Practitioner

## 2021-12-10 DIAGNOSIS — I10 Essential (primary) hypertension: Secondary | ICD-10-CM | POA: Diagnosis not present

## 2021-12-10 DIAGNOSIS — E1165 Type 2 diabetes mellitus with hyperglycemia: Secondary | ICD-10-CM | POA: Diagnosis not present

## 2021-12-10 DIAGNOSIS — M5441 Lumbago with sciatica, right side: Secondary | ICD-10-CM | POA: Diagnosis not present

## 2021-12-10 DIAGNOSIS — M25551 Pain in right hip: Secondary | ICD-10-CM | POA: Diagnosis not present

## 2021-12-10 DIAGNOSIS — E782 Mixed hyperlipidemia: Secondary | ICD-10-CM | POA: Diagnosis not present

## 2021-12-10 DIAGNOSIS — M47816 Spondylosis without myelopathy or radiculopathy, lumbar region: Secondary | ICD-10-CM | POA: Diagnosis not present

## 2021-12-10 DIAGNOSIS — M545 Low back pain, unspecified: Secondary | ICD-10-CM | POA: Diagnosis not present

## 2022-02-06 DIAGNOSIS — E538 Deficiency of other specified B group vitamins: Secondary | ICD-10-CM | POA: Diagnosis not present

## 2022-02-06 DIAGNOSIS — E559 Vitamin D deficiency, unspecified: Secondary | ICD-10-CM | POA: Diagnosis not present

## 2022-02-06 DIAGNOSIS — E039 Hypothyroidism, unspecified: Secondary | ICD-10-CM | POA: Diagnosis not present

## 2022-02-06 DIAGNOSIS — Z0000 Encounter for general adult medical examination without abnormal findings: Secondary | ICD-10-CM | POA: Diagnosis not present

## 2022-02-06 DIAGNOSIS — E782 Mixed hyperlipidemia: Secondary | ICD-10-CM | POA: Diagnosis not present

## 2022-02-06 DIAGNOSIS — R638 Other symptoms and signs concerning food and fluid intake: Secondary | ICD-10-CM | POA: Diagnosis not present

## 2022-02-06 DIAGNOSIS — M4306 Spondylolysis, lumbar region: Secondary | ICD-10-CM | POA: Diagnosis not present

## 2022-02-06 DIAGNOSIS — E1165 Type 2 diabetes mellitus with hyperglycemia: Secondary | ICD-10-CM | POA: Diagnosis not present

## 2022-03-17 DIAGNOSIS — M5451 Vertebrogenic low back pain: Secondary | ICD-10-CM | POA: Diagnosis not present

## 2022-04-14 DIAGNOSIS — M5416 Radiculopathy, lumbar region: Secondary | ICD-10-CM | POA: Diagnosis not present

## 2022-04-28 DIAGNOSIS — M5451 Vertebrogenic low back pain: Secondary | ICD-10-CM | POA: Diagnosis not present

## 2022-06-02 DIAGNOSIS — Z1231 Encounter for screening mammogram for malignant neoplasm of breast: Secondary | ICD-10-CM | POA: Diagnosis not present

## 2022-06-03 DIAGNOSIS — H5712 Ocular pain, left eye: Secondary | ICD-10-CM | POA: Diagnosis not present

## 2022-06-03 DIAGNOSIS — H16223 Keratoconjunctivitis sicca, not specified as Sjogren's, bilateral: Secondary | ICD-10-CM | POA: Diagnosis not present

## 2022-06-03 DIAGNOSIS — Z961 Presence of intraocular lens: Secondary | ICD-10-CM | POA: Diagnosis not present

## 2022-06-16 ENCOUNTER — Ambulatory Visit: Payer: Medicare Other | Admitting: Cardiology

## 2022-06-22 DIAGNOSIS — N6489 Other specified disorders of breast: Secondary | ICD-10-CM | POA: Diagnosis not present

## 2022-06-22 DIAGNOSIS — R92321 Mammographic fibroglandular density, right breast: Secondary | ICD-10-CM | POA: Diagnosis not present

## 2022-09-24 ENCOUNTER — Encounter: Payer: Self-pay | Admitting: Radiology

## 2022-12-23 DIAGNOSIS — E039 Hypothyroidism, unspecified: Secondary | ICD-10-CM | POA: Diagnosis not present

## 2022-12-23 DIAGNOSIS — I10 Essential (primary) hypertension: Secondary | ICD-10-CM | POA: Diagnosis not present

## 2022-12-23 DIAGNOSIS — E1165 Type 2 diabetes mellitus with hyperglycemia: Secondary | ICD-10-CM | POA: Diagnosis not present

## 2022-12-23 DIAGNOSIS — K219 Gastro-esophageal reflux disease without esophagitis: Secondary | ICD-10-CM | POA: Diagnosis not present

## 2022-12-23 DIAGNOSIS — Z1330 Encounter for screening examination for mental health and behavioral disorders, unspecified: Secondary | ICD-10-CM | POA: Diagnosis not present

## 2022-12-23 DIAGNOSIS — Z794 Long term (current) use of insulin: Secondary | ICD-10-CM | POA: Diagnosis not present

## 2022-12-23 DIAGNOSIS — E782 Mixed hyperlipidemia: Secondary | ICD-10-CM | POA: Diagnosis not present

## 2023-01-04 DIAGNOSIS — H02421 Myogenic ptosis of right eyelid: Secondary | ICD-10-CM | POA: Diagnosis not present

## 2023-01-04 DIAGNOSIS — Z961 Presence of intraocular lens: Secondary | ICD-10-CM | POA: Diagnosis not present

## 2023-01-04 DIAGNOSIS — E119 Type 2 diabetes mellitus without complications: Secondary | ICD-10-CM | POA: Diagnosis not present

## 2023-03-22 DIAGNOSIS — M5136 Other intervertebral disc degeneration, lumbar region: Secondary | ICD-10-CM | POA: Diagnosis not present

## 2023-03-22 DIAGNOSIS — M47816 Spondylosis without myelopathy or radiculopathy, lumbar region: Secondary | ICD-10-CM | POA: Diagnosis not present

## 2023-04-07 ENCOUNTER — Other Ambulatory Visit (HOSPITAL_COMMUNITY): Payer: Self-pay | Admitting: Adult Health Nurse Practitioner

## 2023-04-07 DIAGNOSIS — Z6834 Body mass index (BMI) 34.0-34.9, adult: Secondary | ICD-10-CM | POA: Diagnosis not present

## 2023-04-07 DIAGNOSIS — N289 Disorder of kidney and ureter, unspecified: Secondary | ICD-10-CM | POA: Diagnosis not present

## 2023-04-07 DIAGNOSIS — M8589 Other specified disorders of bone density and structure, multiple sites: Secondary | ICD-10-CM | POA: Diagnosis not present

## 2023-04-07 DIAGNOSIS — Z1330 Encounter for screening examination for mental health and behavioral disorders, unspecified: Secondary | ICD-10-CM | POA: Diagnosis not present

## 2023-04-07 DIAGNOSIS — E6609 Other obesity due to excess calories: Secondary | ICD-10-CM | POA: Diagnosis not present

## 2023-04-07 DIAGNOSIS — I2510 Atherosclerotic heart disease of native coronary artery without angina pectoris: Secondary | ICD-10-CM | POA: Diagnosis not present

## 2023-04-07 DIAGNOSIS — E039 Hypothyroidism, unspecified: Secondary | ICD-10-CM | POA: Diagnosis not present

## 2023-04-07 DIAGNOSIS — E1165 Type 2 diabetes mellitus with hyperglycemia: Secondary | ICD-10-CM | POA: Diagnosis not present

## 2023-04-07 DIAGNOSIS — I10 Essential (primary) hypertension: Secondary | ICD-10-CM | POA: Diagnosis not present

## 2023-04-09 DIAGNOSIS — M47816 Spondylosis without myelopathy or radiculopathy, lumbar region: Secondary | ICD-10-CM | POA: Diagnosis not present

## 2023-04-09 DIAGNOSIS — M4306 Spondylolysis, lumbar region: Secondary | ICD-10-CM | POA: Diagnosis not present

## 2023-04-20 DIAGNOSIS — M47816 Spondylosis without myelopathy or radiculopathy, lumbar region: Secondary | ICD-10-CM | POA: Diagnosis not present

## 2023-04-20 DIAGNOSIS — E1165 Type 2 diabetes mellitus with hyperglycemia: Secondary | ICD-10-CM | POA: Diagnosis not present

## 2023-05-10 DIAGNOSIS — M47816 Spondylosis without myelopathy or radiculopathy, lumbar region: Secondary | ICD-10-CM | POA: Diagnosis not present

## 2023-06-09 ENCOUNTER — Other Ambulatory Visit (HOSPITAL_COMMUNITY): Payer: Medicare Other

## 2023-06-10 ENCOUNTER — Ambulatory Visit (HOSPITAL_COMMUNITY)
Admission: RE | Admit: 2023-06-10 | Discharge: 2023-06-10 | Disposition: A | Discharge status: Home / Self Care | Payer: Medicare Other | Source: Ambulatory Visit | Attending: Adult Health Nurse Practitioner | Admitting: Adult Health Nurse Practitioner

## 2023-06-10 DIAGNOSIS — M8589 Other specified disorders of bone density and structure, multiple sites: Secondary | ICD-10-CM | POA: Diagnosis not present

## 2023-06-10 DIAGNOSIS — M85852 Other specified disorders of bone density and structure, left thigh: Secondary | ICD-10-CM | POA: Diagnosis not present

## 2023-06-10 DIAGNOSIS — Z780 Asymptomatic menopausal state: Secondary | ICD-10-CM | POA: Diagnosis not present

## 2023-06-28 DIAGNOSIS — E1165 Type 2 diabetes mellitus with hyperglycemia: Secondary | ICD-10-CM | POA: Diagnosis not present

## 2023-06-28 DIAGNOSIS — Z79899 Other long term (current) drug therapy: Secondary | ICD-10-CM | POA: Diagnosis not present

## 2023-06-28 DIAGNOSIS — Z6834 Body mass index (BMI) 34.0-34.9, adult: Secondary | ICD-10-CM | POA: Diagnosis not present

## 2023-06-28 DIAGNOSIS — E669 Obesity, unspecified: Secondary | ICD-10-CM | POA: Diagnosis not present

## 2023-06-28 DIAGNOSIS — M47816 Spondylosis without myelopathy or radiculopathy, lumbar region: Secondary | ICD-10-CM | POA: Diagnosis not present

## 2023-07-19 DIAGNOSIS — R600 Localized edema: Secondary | ICD-10-CM | POA: Diagnosis not present

## 2023-07-19 DIAGNOSIS — E1169 Type 2 diabetes mellitus with other specified complication: Secondary | ICD-10-CM | POA: Diagnosis not present

## 2023-07-26 DIAGNOSIS — M47816 Spondylosis without myelopathy or radiculopathy, lumbar region: Secondary | ICD-10-CM | POA: Diagnosis not present

## 2023-07-27 ENCOUNTER — Ambulatory Visit: Payer: Medicare Other | Attending: Cardiovascular Disease | Admitting: Cardiovascular Disease

## 2023-07-27 ENCOUNTER — Encounter: Payer: Self-pay | Admitting: Cardiovascular Disease

## 2023-07-27 VITALS — BP 116/64 | HR 71 | Ht 64.0 in | Wt 193.4 lb

## 2023-07-27 DIAGNOSIS — I150 Renovascular hypertension: Secondary | ICD-10-CM | POA: Diagnosis not present

## 2023-07-27 DIAGNOSIS — R600 Localized edema: Secondary | ICD-10-CM | POA: Diagnosis not present

## 2023-07-27 DIAGNOSIS — R0602 Shortness of breath: Secondary | ICD-10-CM | POA: Diagnosis not present

## 2023-07-27 DIAGNOSIS — R7989 Other specified abnormal findings of blood chemistry: Secondary | ICD-10-CM

## 2023-07-27 DIAGNOSIS — I2510 Atherosclerotic heart disease of native coronary artery without angina pectoris: Secondary | ICD-10-CM | POA: Diagnosis not present

## 2023-07-27 DIAGNOSIS — E782 Mixed hyperlipidemia: Secondary | ICD-10-CM | POA: Diagnosis not present

## 2023-07-27 DIAGNOSIS — E0965 Drug or chemical induced diabetes mellitus with hyperglycemia: Secondary | ICD-10-CM | POA: Diagnosis not present

## 2023-07-27 DIAGNOSIS — R0609 Other forms of dyspnea: Secondary | ICD-10-CM | POA: Diagnosis not present

## 2023-07-27 DIAGNOSIS — E039 Hypothyroidism, unspecified: Secondary | ICD-10-CM | POA: Diagnosis not present

## 2023-07-27 DIAGNOSIS — R051 Acute cough: Secondary | ICD-10-CM | POA: Diagnosis not present

## 2023-07-27 MED ORDER — EMPAGLIFLOZIN 10 MG PO TABS: 10.0000 mg | ORAL_TABLET | Freq: Every day | ORAL | 3 refills | Status: DC

## 2023-07-27 NOTE — Progress Notes (Signed)
 Cardiology Office Note:  .   Date:  07/27/2023  ID:  Meagan Molina, DOB [birth year], MRN [id number] PCP: Suanne Pfeiffer, NP  [facility] Providers Cardiologist:  None { History of Present Illness: .   Meagan Molina is a 72 y.o. female with history of CAD, hypertension, hyperlipidemia who presents for the evaluation of lower extreme edema at the request of Suanne Pfeiffer, NP.  History of Present Illness   Meagan Molina, a 72 year old with a history of CAD, hypertension, and hyperlipidemia, presents with shortness of breath and lower extremity edema. These symptoms have been ongoing for approximately three weeks. She denies any changes in her daily routine, new medications, or recent illness. She has noticed a new cough, which is triggered by deep breaths. She denies any chest pain but describes an uncomfortable feeling, which she attributes to her shortness of breath. She has been on torsemide since yesterday, which she reports has helped with her symptoms. She denies any recent weight gain and, in fact, reports some weight loss. She denies any history of smoking or alcohol use. She has a history of a heart attack in 2005, for which she had a stent placed. She also had a heart catheterization in 2013, which reportedly showed no significant issues. No CP but reports fullness in her chest. No recent illness. No new medications.       NT-Pro-BNP 1260 TSH 1.76     Problem List CAD -posterior MI 2005 with PCI 2005 -LHC 2013: normal LAD/patent LCX stent/50% non-dom RCA  2. HTN 3. HLD -T chol 134, HDL 43, LDL 62, TG 175 4. DM -A1c 5.3     ROS: All other ROS reviewed and negative. Pertinent positives noted in the HPI.     Studies Reviewed: SABRA   EKG Interpretation Date/Time:  Tuesday July 27 2023 13:26:50 EST Ventricular Rate:  69 PR Interval:  174 QRS Duration:  128 QT Interval:  438 QTC Calculation: 469 R Axis:   -34  Text Interpretation: Normal sinus rhythm Left  axis deviation Non-specific intra-ventricular conduction block Minimal voltage criteria for LVH, may be normal variant ( Cornell product ) When compared with ECG of 30-Jul-2009 11:13, Confirmed by Barbaraann Kotyk [phone]) on 07/27/2023 1:28:05 PM   Physical Exam:   VS:  BP 116/64 (BP Location: Left Arm, Patient Position: Sitting, Cuff Size: Normal)   Pulse 71   Ht 5' 4 (1.626 m)   Wt 193 lb 6.4 oz (87.7 kg)   SpO2 100%   BMI 33.20 kg/m    Wt Readings from Last 3 Encounters:  07/27/23 193 lb 6.4 oz (87.7 kg)  03/28/21 195 lb 9.6 oz (88.7 kg)  01/07/21 183 lb 9.6 oz (83.3 kg)    GEN: Well nourished, well developed in no acute distress NECK: No JVD; No carotid bruits CARDIAC: RRR, no murmurs, rubs, gallops RESPIRATORY:  Clear to auscultation without rales, wheezing or rhonchi  ABDOMEN: Soft, non-tender, non-distended EXTREMITIES:  No edema; No deformity  ASSESSMENT AND PLAN: .   Assessment and Plan    Congestive Heart Failure New onset shortness of breath, lower extremity edema, and elevated NT proBNP. Symptoms improved with Torsemide. Possible systolic or diastolic heart failure. -Order echocardiogram to evaluate LV function. -Order nuclear medicine stress test to re-evaluate coronary arteries. -Continue Torsemide 20mg  daily. -Return for follow-up in 6-8 weeks. -further titration of meds pending echo.   Diabetes Mellitus Well controlled. Currently on Pioglitazone , which is contraindicated in CHF. -Discontinue Pioglitazone . -Start Jardiance  10mg  daily.  Hypertension Well controlled on Carvedilol  12.5mg  BID and Lisinopril  5mg  daily. -Continue current regimen.  Hyperlipidemia LDL cholesterol is 56. Currently on Simvastatin . -Continue Simvastatin .  CAD History of heart attack in 2005 with stent placement. Last heart cath in 2013 showed 50% disease in nondominant RCA. Currently on Aspirin  and Plavix . -Discontinue Plavix . -Continue Aspirin  monotherapy. -lipids at goal -stress  test as above          Informed Consent   Shared Decision Making/Informed Consent The risks [chest pain, shortness of breath, cardiac arrhythmias, dizziness, blood pressure fluctuations, myocardial infarction, stroke/transient ischemic attack, nausea, vomiting, allergic reaction, radiation exposure, metallic taste sensation and life-threatening complications (estimated to be 1 in 10,000)], benefits (risk stratification, diagnosing coronary artery disease, treatment guidance) and alternatives of a nuclear stress test were discussed in detail with Meagan Molina and she agrees to proceed.      Follow-up: Return in about 2 months (around 09/24/2023).   Signed, Darryle DASEN. Barbaraann, MD, [facility]  [facility]  [address] [place], [place] [address] [phone]  2:13 PM

## 2023-07-27 NOTE — Patient Instructions (Signed)
 Medication Instructions:  - Stop clopidogrel  (PLAVIX ) 75 MG tablet  - Stop pioglitazone  (ACTOS ) 45 MG tablet  - START JARDIANCE  10MG  daily     *If you need a refill on your cardiac medications before your next appointment, please call your pharmacy*   Lab Work: None    If you have labs (blood work) drawn today and your tests are completely normal, you will receive your results only by: MyChart Message (if you have MyChart) OR A paper copy in the mail If you have any lab test that is abnormal or we need to change your treatment, we will call you to review the results.   Testing/Procedures: Echo will be scheduled at [address].  Your physician has requested that you have an echocardiogram. Echocardiography is a painless test that uses sound waves to create images of your heart. It provides your doctor with information about the size and shape of your heart and how well your heart's chambers and valves are working. This procedure takes approximately one hour. There are no restrictions for this procedure. Please do NOT wear cologne, perfume, aftershave, or lotions (deodorant is allowed). Please arrive 15 minutes prior to your appointment time.   .Dr. Barbaraann has ordered a Lexiscan  Myocardial Perfusion Imaging Study.  Please arrive 15 minutes prior to your appointment time for registration and insurance purposes.   The test will take approximately 3 to 4 hours to complete; you may bring reading material.  If someone comes with you to your appointment, they will need to remain in the main lobby due to limited space in the testing area. **If you are pregnant or breastfeeding, please notify the nuclear lab prior to your appointment**   How to prepare for your Myocardial Perfusion Test: Do not eat or drink 3 hours prior to your test, except you may have water. Do not consume products containing caffeine (regular or decaffeinated) 12 hours prior to your test. (ex: coffee,  chocolate, sodas, tea). Do wear comfortable clothes (no dresses or overalls) and walking shoes, tennis shoes preferred (No heels or open toe shoes are allowed). Do NOT wear cologne, perfume, aftershave, or lotions (deodorant is allowed). If you use an inhaler, use it the AM of your test and bring it with you.  If you use a nebulizer, use it the AM of your test.  If these instructions are not followed, your test will have to be rescheduled.    Follow-Up: At [facility], you and your health needs are our priority.  As part of our continuing mission to provide you with exceptional heart care, we have created designated Provider Care Teams.  These Care Teams include your primary Cardiologist (physician) and Advanced Practice Providers (APPs -  Physician Assistants and Nurse Practitioners) who all work together to provide you with the care you need, when you need it.  We recommend signing up for the patient portal called MyChart.  Sign up information is provided on this After Visit Summary.  MyChart is used to connect with patients for Virtual Visits (Telemedicine).  Patients are able to view lab/test results, encounter notes, upcoming appointments, etc.  Non-urgent messages can be sent to your provider as well.   To learn more about what you can do with MyChart, go to [email or web address].    Your next appointment:   6 - 8 week(s)  The format for your next appointment:   In Person  Provider:   Darryle Barbaraann, MD   Other Instructions

## 2023-08-17 ENCOUNTER — Telehealth (HOSPITAL_COMMUNITY): Payer: Self-pay | Admitting: *Deleted

## 2023-08-17 NOTE — Telephone Encounter (Signed)
Left message on voicemail per DPR in reference to upcoming appointment scheduled on  08/24/23 @ 12:45 with detailed instructions given per Myocardial Perfusion Study Information Sheet for the test. LM to arrive 15 minutes early, and that it is imperative to arrive on time for appointment to keep from having the test rescheduled. If you need to cancel or reschedule your appointment, please call the office within 24 hours of your appointment. Failure to do so may result in a cancellation of your appointment, and a $50 no show fee. Phone number given for call back for any questions. Ricky Ala

## 2023-08-20 ENCOUNTER — Other Ambulatory Visit (HOSPITAL_COMMUNITY): Payer: Medicare Other

## 2023-08-24 ENCOUNTER — Ambulatory Visit (HOSPITAL_COMMUNITY): Payer: Medicare Other | Attending: Cardiology

## 2023-08-24 ENCOUNTER — Ambulatory Visit (HOSPITAL_BASED_OUTPATIENT_CLINIC_OR_DEPARTMENT_OTHER): Payer: Medicare Other

## 2023-08-24 DIAGNOSIS — R0602 Shortness of breath: Secondary | ICD-10-CM | POA: Diagnosis present

## 2023-08-24 LAB — MYOCARDIAL PERFUSION IMAGING
Estimated workload: 1
Exercise duration (min): 1 min
Exercise duration (sec): 0 s
LV dias vol: 86 mL (ref 46–106)
LV sys vol: 49 mL
MPHR: 147 {beats}/min
Nuc Stress EF: 43 %
Peak HR: 108 {beats}/min
Percent HR: 73 %
Rest HR: 75 {beats}/min
Rest Nuclear Isotope Dose: 11 mCi
SDS: 2
SRS: 8
SSS: 10
ST Depression (mm): 0 mm
Stress Nuclear Isotope Dose: 32.8 mCi
TID: 1.16

## 2023-08-24 LAB — ECHOCARDIOGRAM COMPLETE
Area-P 1/2: 3.75 cm2
Height: 64 in
S' Lateral: 4.1 cm
Weight: 3088 [oz_av]

## 2023-08-24 MED ORDER — TECHNETIUM TC 99M TETROFOSMIN IV KIT
11.0000 | PACK | Freq: Once | INTRAVENOUS | Status: AC | PRN
  Administered 2023-08-24: 11 via INTRAVENOUS

## 2023-08-24 MED ORDER — REGADENOSON 0.4 MG/5ML IV SOLN
0.4000 mg | Freq: Once | INTRAVENOUS | Status: AC
  Administered 2023-08-24: 0.4 mg via INTRAVENOUS

## 2023-08-24 MED ORDER — PERFLUTREN LIPID MICROSPHERE
1.0000 mL | INTRAVENOUS | Status: AC | PRN
  Administered 2023-08-24: 2 mL via INTRAVENOUS

## 2023-08-24 MED ORDER — TECHNETIUM TC 99M TETROFOSMIN IV KIT
32.8000 | PACK | Freq: Once | INTRAVENOUS | Status: AC | PRN
Start: 2023-08-24 — End: 2023-08-24
  Administered 2023-08-24: 32.8 via INTRAVENOUS

## 2023-08-25 ENCOUNTER — Encounter: Payer: Self-pay | Admitting: Cardiovascular Disease

## 2023-09-06 NOTE — Progress Notes (Signed)
Cardiology Office Note:  .   Date:  09/08/2023  ID:  Meagan Molina, DOB 1951/05/02, MRN [id number] PCP: Meagan Rutherford, NP  [facility] Providers Cardiologist:  None { History of Present Illness: .    Chief Complaint  Patient presents with   Follow-up    6-8 weeks.    Meagan Molina is a 73 y.o. female with history of CAD, CHF, DM, HLD who presents for follow-up.    History of Present Illness   Meagan Molina is a 73 year old female with CAD, CHF, and diabetes who presents for follow-up. She is accompanied by her sister, Meagan Molina.  She has a history of congestive heart failure with a recent diagnosis of systolic heart failure. A stress test indicated an old myocardial infarction with an ejection fraction of 45-50%, consistent with ischemic cardiomyopathy. She experiences shortness of breath, particularly severe in the mornings, and has lost 11 pounds. No chest pain or tightness is reported. She manages leg swelling with torsemide 20 mg as needed.  Her coronary artery disease includes a history of a stent placement in 2005. A recent stress test suggests possible closure of the stent, but she reports no current angina.  Diabetes is managed with Jardiance and glipizide, with an A1c of 6.5. Meagan Molina was discontinued in December, and she is not on Actos due to contraindications.  Current medications include Jardiance, carvedilol 12.5 mg twice daily, lisinopril 5 mg daily, and aspirin. She has stopped taking Mounjaro and is not on Actos.  She has a known allergy to sulfa drugs and red dye 40, with no issues reported with contrast dyes used in medical procedures.  Her blood pressure has been low, with a recent reading of 94/56 mmHg.           Problem List CAD -posterior MI 2005 with PCI 2005 -LHC 2013: normal LAD/patent LCX stent/50% non-dom RCA  2. HTN 3. HLD -T chol 134, HDL 43, LDL 62, TG 175 4. DM -A1c 6.5     ROS: All other ROS reviewed and negative. Pertinent  positives noted in the HPI.     Studies Reviewed: Marland Kitchen        TTE 08/24/2023  1. Possible subaortic membrane (see images 33 and 63). Left ventricular  ejection fraction, by estimation, is 45 to 50%. The left ventricle has  mildly decreased function. The left ventricle demonstrates regional wall  motion abnormalities (see scoring  diagram/findings for description). Left ventricular diastolic parameters  are indeterminate. There is hypokinesis of the left ventricular, basal-mid  inferolateral wall and inferior wall.   2. Right ventricular systolic function is normal. The right ventricular  size is normal.   3. The mitral valve is grossly normal. Trivial mitral valve  regurgitation. No evidence of mitral stenosis. Moderate mitral annular  calcification.   4. The aortic valve was not well visualized. There is mild calcification  of the aortic valve. Aortic valve regurgitation is not visualized. Aortic  valve sclerosis is present, with no evidence of aortic valve stenosis.   5. The inferior vena cava is normal in size with greater than 50%  respiratory variability, suggesting right atrial pressure of 3 mmHg.   NM Stress 08/24/2023   Findings are consistent with infarction and no ischemia. The study is intermediate risk due to reduced systolic function.   No ST deviation was noted.   LV perfusion is abnormal. There is no evidence of ischemia. There is evidence of infarction. Defect 1: There is a  small defect with moderate reduction in uptake present in the mid to basal inferolateral location(s) that is fixed. There is abnormal wall motion in the defect area. Consistent with infarction.   Left ventricular function is abnormal. Global function is moderately reduced. Nuclear stress EF: 43%. The left ventricular ejection fraction is moderately decreased (30-44%). End diastolic cavity size is normal. End systolic cavity size is normal. Physical Exam:   VS:  BP (!) 94/56 (BP Location: Left Arm, Patient  Position: Sitting, Cuff Size: Normal)   Pulse 74   Ht 5\' 4"  (1.626 m)   Wt 182 lb (82.6 kg)   BMI 31.24 kg/m    Wt Readings from Last 3 Encounters:  09/08/23 182 lb (82.6 kg)  08/24/23 193 lb (87.5 kg)  07/27/23 193 lb 6.4 oz (87.7 kg)    GEN: Well nourished, well developed in no acute distress NECK: No JVD; No carotid bruits CARDIAC: RRR, no murmurs, rubs, gallops RESPIRATORY:  Clear to auscultation without rales, wheezing or rhonchi  ABDOMEN: Soft, non-tender, non-distended EXTREMITIES:  No edema; No deformity  ASSESSMENT AND PLAN: .   Assessment and Plan    Ischemic Cardiomyopathy CAD without angina  EF 45-50% with symptoms of systolic heart failure. Stress test shows old MI and possible closed stent. No angina reported. -weight down 11 lbs. Continue torsemide 20 mg PRN -Schedule right and left cardiac catheterization on 09/15/2023 to further define coronary anatomy. -Continue Carvedilol 12.5mg  BID, Lisinopril 5mg  daily, and Jardiance 10mg  daily. -Hold Jardiance and Glipizide day before catheterization. -Return for follow-up visit 2 weeks post-catheterization. -sulfa allergy so aldactone out. BP too low for entresto; we will try at next visit.    CAD s/p PCI Hyperlipidemia -posterior MI in past with PCI. No with CHF.  -LDL at goal (62); Check lipids today to determine if LDL needs to be lowered further. -LHC to define anatomy in setting of CHF  Diabetes Well controlled with A1C of 6.5. -Continue current management.  Hypertension Blood pressure slightly low (94/56). -Monitor blood pressure closely. -Consider transitioning to Entresto at follow-up visit post-catheterization.          Informed Consent   Shared Decision Making/Informed Consent The risks [stroke (1 in 1000), death (1 in 1000), kidney failure [usually temporary] (1 in 500), bleeding (1 in 200), allergic reaction [possibly serious] (1 in 200)], benefits (diagnostic support and management of coronary  artery disease) and alternatives of a cardiac catheterization were discussed in detail with Meagan Molina and she is willing to proceed.      Follow-up: Return in about 3 weeks (around 09/29/2023).  Signed, Lenna Gilford. Flora Lipps, MD, [facility]  [facility]  [address] [place], [place] [address] [phone]  12:45 PM

## 2023-09-06 NOTE — H&P (View-Only) (Signed)
 Cardiology Office Note:  .   Date:  09/08/2023  ID:  [name], DOB 1951/05/02, MRN [id number] PCP: Roe Rutherford, NP  [facility] Providers Cardiologist:  None { History of Present Illness: .    Chief Complaint  Patient presents with   Follow-up    6-8 weeks.    Meagan Molina is a 73 y.o. female with history of CAD, CHF, DM, HLD who presents for follow-up.    History of Present Illness   Meagan Molina is a 73 year old female with CAD, CHF, and diabetes who presents for follow-up. She is accompanied by her sister, [name].  She has a history of congestive heart failure with a recent diagnosis of systolic heart failure. A stress test indicated an old myocardial infarction with an ejection fraction of 45-50%, consistent with ischemic cardiomyopathy. She experiences shortness of breath, particularly severe in the mornings, and has lost 11 pounds. No chest pain or tightness is reported. She manages leg swelling with torsemide 20 mg as needed.  Her coronary artery disease includes a history of a stent placement in 2005. A recent stress test suggests possible closure of the stent, but she reports no current angina.  Diabetes is managed with Jardiance and glipizide, with an A1c of 6.5. Greggory Keen was discontinued in December, and she is not on Actos due to contraindications.  Current medications include Jardiance, carvedilol 12.5 mg twice daily, lisinopril 5 mg daily, and aspirin. She has stopped taking Mounjaro and is not on Actos.  She has a known allergy to sulfa drugs and red dye 40, with no issues reported with contrast dyes used in medical procedures.  Her blood pressure has been low, with a recent reading of 94/56 mmHg.           Problem List CAD -posterior MI 2005 with PCI 2005 -LHC 2013: normal LAD/patent LCX stent/50% non-dom RCA  2. HTN 3. HLD -T chol 134, HDL 43, LDL 62, TG 175 4. DM -A1c 6.5     ROS: All other ROS reviewed and negative. Pertinent  positives noted in the HPI.     Studies Reviewed: Marland Kitchen        TTE 08/24/2023  1. Possible subaortic membrane (see images 33 and 63). Left ventricular  ejection fraction, by estimation, is 45 to 50%. The left ventricle has  mildly decreased function. The left ventricle demonstrates regional wall  motion abnormalities (see scoring  diagram/findings for description). Left ventricular diastolic parameters  are indeterminate. There is hypokinesis of the left ventricular, basal-mid  inferolateral wall and inferior wall.   2. Right ventricular systolic function is normal. The right ventricular  size is normal.   3. The mitral valve is grossly normal. Trivial mitral valve  regurgitation. No evidence of mitral stenosis. Moderate mitral annular  calcification.   4. The aortic valve was not well visualized. There is mild calcification  of the aortic valve. Aortic valve regurgitation is not visualized. Aortic  valve sclerosis is present, with no evidence of aortic valve stenosis.   5. The inferior vena cava is normal in size with greater than 50%  respiratory variability, suggesting right atrial pressure of 3 mmHg.   NM Stress 08/24/2023   Findings are consistent with infarction and no ischemia. The study is intermediate risk due to reduced systolic function.   No ST deviation was noted.   LV perfusion is abnormal. There is no evidence of ischemia. There is evidence of infarction. Defect 1: There is a  small defect with moderate reduction in uptake present in the mid to basal inferolateral location(s) that is fixed. There is abnormal wall motion in the defect area. Consistent with infarction.   Left ventricular function is abnormal. Global function is moderately reduced. Nuclear stress EF: 43%. The left ventricular ejection fraction is moderately decreased (30-44%). End diastolic cavity size is normal. End systolic cavity size is normal. Physical Exam:   VS:  BP (!) 94/56 (BP Location: Left Arm, Patient  Position: Sitting, Cuff Size: Normal)   Pulse 74   Ht 5\' 4"  (1.626 m)   Wt 182 lb (82.6 kg)   BMI 31.24 kg/m    Wt Readings from Last 3 Encounters:  09/08/23 182 lb (82.6 kg)  08/24/23 193 lb (87.5 kg)  07/27/23 193 lb 6.4 oz (87.7 kg)    GEN: Well nourished, well developed in no acute distress NECK: No JVD; No carotid bruits CARDIAC: RRR, no murmurs, rubs, gallops RESPIRATORY:  Clear to auscultation without rales, wheezing or rhonchi  ABDOMEN: Soft, non-tender, non-distended EXTREMITIES:  No edema; No deformity  ASSESSMENT AND PLAN: .   Assessment and Plan    Ischemic Cardiomyopathy CAD without angina  EF 45-50% with symptoms of systolic heart failure. Stress test shows old MI and possible closed stent. No angina reported. -weight down 11 lbs. Continue torsemide 20 mg PRN -Schedule right and left cardiac catheterization on 09/15/2023 to further define coronary anatomy. -Continue Carvedilol 12.5mg  BID, Lisinopril 5mg  daily, and Jardiance 10mg  daily. -Hold Jardiance and Glipizide day before catheterization. -Return for follow-up visit 2 weeks post-catheterization. -sulfa allergy so aldactone out. BP too low for entresto; we will try at next visit.    CAD s/p PCI Hyperlipidemia -posterior MI in past with PCI. No with CHF.  -LDL at goal (62); Check lipids today to determine if LDL needs to be lowered further. -LHC to define anatomy in setting of CHF  Diabetes Well controlled with A1C of 6.5. -Continue current management.  Hypertension Blood pressure slightly low (94/56). -Monitor blood pressure closely. -Consider transitioning to Entresto at follow-up visit post-catheterization.          Informed Consent   Shared Decision Making/Informed Consent The risks [stroke (1 in 1000), death (1 in 1000), kidney failure [usually temporary] (1 in 500), bleeding (1 in 200), allergic reaction [possibly serious] (1 in 200)], benefits (diagnostic support and management of coronary  artery disease) and alternatives of a cardiac catheterization were discussed in detail with Meagan Molina and she is willing to proceed.      Follow-up: Return in about 3 weeks (around 09/29/2023).  Signed, Lenna Gilford. Flora Lipps, MD, [facility]  [facility]  [address] [place], [place] [address] [phone]  12:45 PM

## 2023-09-08 ENCOUNTER — Ambulatory Visit: Payer: Medicare Other | Attending: Cardiovascular Disease | Admitting: Cardiovascular Disease

## 2023-09-08 ENCOUNTER — Encounter: Payer: Self-pay | Admitting: Cardiovascular Disease

## 2023-09-08 VITALS — BP 94/56 | HR 74 | Ht 64.0 in | Wt 182.0 lb

## 2023-09-08 DIAGNOSIS — I2510 Atherosclerotic heart disease of native coronary artery without angina pectoris: Secondary | ICD-10-CM

## 2023-09-08 DIAGNOSIS — R0602 Shortness of breath: Secondary | ICD-10-CM

## 2023-09-08 DIAGNOSIS — Z01818 Encounter for other preprocedural examination: Secondary | ICD-10-CM | POA: Diagnosis present

## 2023-09-08 DIAGNOSIS — I150 Renovascular hypertension: Secondary | ICD-10-CM

## 2023-09-08 DIAGNOSIS — E782 Mixed hyperlipidemia: Secondary | ICD-10-CM | POA: Diagnosis present

## 2023-09-08 DIAGNOSIS — I5022 Chronic systolic (congestive) heart failure: Secondary | ICD-10-CM | POA: Diagnosis present

## 2023-09-08 NOTE — Patient Instructions (Addendum)
Medication Instructions:  Your physician recommends that you continue on your current medications as directed. Please refer to the Current Medication list given to you today.  *If you need a refill on your cardiac medications before your next appointment, please call your pharmacy*   Lab Work: BMET, CBC, Lipids  If you have labs (blood work) drawn today and your tests are completely normal, you will receive your results only by: MyChart Message (if you have MyChart) OR A paper copy in the mail If you have any lab test that is abnormal or we need to change your treatment, we will call you to review the results.   Testing/Procedures: You are scheduled for a Cardiac Catheterization on Wednesday, February 19 with Dr. Bryan Lemma.  1. Please arrive at the [facility] (Main Entrance A) at [facility]: [address] [place], [place] [address] at 8:00 AM (This time is 2 hour(s) before your procedure to ensure your preparation).   Free valet parking service is available. You will check in at ADMITTING. The support person will be asked to wait in the waiting room.  It is OK to have someone drop you off and come back when you are ready to be discharged.    Special note: Every effort is made to have your procedure done on time. Please understand that emergencies sometimes delay scheduled procedures.  2. Diet: Do not eat solid foods after midnight.  The patient may have clear liquids until 5am upon the day of the procedure.  3. Labs: You will need to have blood drawn on TODAY   4. Medication instructions in preparation for your procedure:   Contrast Allergy: No  Stop taking Lisinopril (Zestril or Prinivil), Torsemide (Demadex) Empagliflozin (Jardiance), Glipizide (Glucotrol XL) Tuesday, February 17.   On the morning of your procedure, take your Aspirin 81 mg and any morning medicines NOT listed above.  You may use sips of water.  5. Plan to go home the same day, you will only stay  overnight if medically necessary. 6. Bring a current list of your medications and current insurance cards. 7. You MUST have a responsible person to drive you home. 8. Someone MUST be with you the first 24 hours after you arrive home or your discharge will be delayed. 9. Please wear clothes that are easy to get on and off and wear slip-on shoes.  Thank you for allowing [place] to care for you!   --  Invasive Cardiovascular services    Follow-Up: At [facility], you and your health needs are our priority.  As part of our continuing mission to provide you with exceptional heart care, we have created designated Provider Care Teams.  These Care Teams include your primary Cardiologist (physician) and Advanced Practice Providers (APPs -  Physician Assistants and Nurse Practitioners) who all work together to provide you with the care you need, when you need it.  Your next appointment:   March 5th at 1pm  Provider:    Lennie Odor, MD

## 2023-09-09 ENCOUNTER — Encounter: Payer: Self-pay | Admitting: Cardiovascular Disease

## 2023-09-09 LAB — LIPID PANEL
Chol/HDL Ratio: 3 {ratio} (ref 0.0–4.4)
Cholesterol, Total: 146 mg/dL (ref 100–199)
HDL: 49 mg/dL (ref >39)
LDL Chol Calc (NIH): 74 mg/dL (ref 0–99)
Triglycerides: 133 mg/dL (ref 0–149)
VLDL Cholesterol Cal: 23 mg/dL (ref 5–40)

## 2023-09-09 LAB — BASIC METABOLIC PANEL
BUN/Creatinine Ratio: 11 — ABNORMAL LOW (ref 12–28)
BUN: 13 mg/dL (ref 8–27)
CO2: 22 mmol/L (ref 20–29)
Calcium: 9.8 mg/dL (ref 8.7–10.3)
Chloride: 106 mmol/L (ref 96–106)
Creatinine, Ser: 1.17 mg/dL — ABNORMAL HIGH (ref 0.57–1.00)
Glucose: 195 mg/dL — ABNORMAL HIGH (ref 70–99)
Potassium: 5.3 mmol/L — ABNORMAL HIGH (ref 3.5–5.2)
Sodium: 141 mmol/L (ref 134–144)
eGFR: 49 mL/min/{1.73_m2} — ABNORMAL LOW (ref >59)

## 2023-09-09 LAB — CBC WITH DIFFERENTIAL/PLATELET
Basophils Absolute: 0.1 10*3/uL (ref 0.0–0.2)
Basos: 1 %
EOS (ABSOLUTE): 0.5 10*3/uL — ABNORMAL HIGH (ref 0.0–0.4)
Eos: 6 %
Hematocrit: 46.4 % (ref 34.0–46.6)
Hemoglobin: 15.2 g/dL (ref 11.1–15.9)
Immature Grans (Abs): 0.1 10*3/uL (ref 0.0–0.1)
Immature Granulocytes: 1 %
Lymphocytes Absolute: 1.9 10*3/uL (ref 0.7–3.1)
Lymphs: 25 %
MCH: 31 pg (ref 26.6–33.0)
MCHC: 32.8 g/dL (ref 31.5–35.7)
MCV: 95 fL (ref 79–97)
Monocytes Absolute: 0.5 10*3/uL (ref 0.1–0.9)
Monocytes: 6 %
Neutrophils Absolute: 4.5 10*3/uL (ref 1.4–7.0)
Neutrophils: 61 %
Platelets: 208 10*3/uL (ref 150–450)
RBC: 4.91 x10E6/uL (ref 3.77–5.28)
RDW: 12.3 % (ref 11.7–15.4)
WBC: 7.4 10*3/uL (ref 3.4–10.8)

## 2023-09-15 ENCOUNTER — Encounter (HOSPITAL_COMMUNITY): Payer: Self-pay | Admitting: Cardiology

## 2023-09-15 ENCOUNTER — Other Ambulatory Visit: Payer: Self-pay

## 2023-09-15 ENCOUNTER — Ambulatory Visit (HOSPITAL_COMMUNITY)
Admission: RE | Admit: 2023-09-15 | Discharge: 2023-09-15 | Disposition: A | Discharge status: Home / Self Care | Payer: Medicare Other | Attending: Cardiology | Admitting: Cardiology

## 2023-09-15 ENCOUNTER — Encounter (HOSPITAL_COMMUNITY)
Admission: RE | Disposition: A | Discharge status: Home / Self Care | Payer: Self-pay | Source: Home / Self Care | Attending: Cardiology

## 2023-09-15 DIAGNOSIS — R9430 Abnormal result of cardiovascular function study, unspecified: Secondary | ICD-10-CM

## 2023-09-15 DIAGNOSIS — Z955 Presence of coronary angioplasty implant and graft: Secondary | ICD-10-CM | POA: Insufficient documentation

## 2023-09-15 DIAGNOSIS — E785 Hyperlipidemia, unspecified: Secondary | ICD-10-CM | POA: Diagnosis not present

## 2023-09-15 DIAGNOSIS — E119 Type 2 diabetes mellitus without complications: Secondary | ICD-10-CM | POA: Diagnosis not present

## 2023-09-15 DIAGNOSIS — I252 Old myocardial infarction: Secondary | ICD-10-CM | POA: Diagnosis not present

## 2023-09-15 DIAGNOSIS — Z79899 Other long term (current) drug therapy: Secondary | ICD-10-CM | POA: Diagnosis not present

## 2023-09-15 DIAGNOSIS — I255 Ischemic cardiomyopathy: Secondary | ICD-10-CM | POA: Diagnosis not present

## 2023-09-15 DIAGNOSIS — Z7982 Long term (current) use of aspirin: Secondary | ICD-10-CM | POA: Insufficient documentation

## 2023-09-15 DIAGNOSIS — Z7984 Long term (current) use of oral hypoglycemic drugs: Secondary | ICD-10-CM | POA: Diagnosis not present

## 2023-09-15 DIAGNOSIS — I110 Hypertensive heart disease with heart failure: Secondary | ICD-10-CM | POA: Diagnosis not present

## 2023-09-15 DIAGNOSIS — I2510 Atherosclerotic heart disease of native coronary artery without angina pectoris: Secondary | ICD-10-CM | POA: Diagnosis not present

## 2023-09-15 DIAGNOSIS — R0602 Shortness of breath: Secondary | ICD-10-CM

## 2023-09-15 DIAGNOSIS — I2729 Other secondary pulmonary hypertension: Secondary | ICD-10-CM | POA: Diagnosis not present

## 2023-09-15 DIAGNOSIS — I5022 Chronic systolic (congestive) heart failure: Secondary | ICD-10-CM | POA: Insufficient documentation

## 2023-09-15 DIAGNOSIS — I509 Heart failure, unspecified: Secondary | ICD-10-CM

## 2023-09-15 HISTORY — PX: RIGHT/LEFT HEART CATH AND CORONARY ANGIOGRAPHY: CATH118266

## 2023-09-15 LAB — GLUCOSE, CAPILLARY
Glucose-Capillary: 229 mg/dL — ABNORMAL HIGH (ref 70–99)
Glucose-Capillary: 168 mg/dL — ABNORMAL HIGH (ref 70–99)

## 2023-09-15 LAB — BASIC METABOLIC PANEL
Anion gap: 14 (ref 5–15)
BUN: 17 mg/dL (ref 8–23)
CO2: 22 mmol/L (ref 22–32)
Calcium: 9.4 mg/dL (ref 8.9–10.3)
Chloride: 102 mmol/L (ref 98–111)
Creatinine, Ser: 1.09 mg/dL — ABNORMAL HIGH (ref 0.44–1.00)
GFR, Estimated: 53 mL/min — ABNORMAL LOW (ref >60)
Glucose, Bld: 230 mg/dL — ABNORMAL HIGH (ref 70–99)
Potassium: 4.1 mmol/L (ref 3.5–5.1)
Sodium: 138 mmol/L (ref 135–145)

## 2023-09-15 LAB — POCT I-STAT EG7
Acid-base deficit: 2 mmol/L (ref 0.0–2.0)
Acid-base deficit: 2 mmol/L (ref 0.0–2.0)
Bicarbonate: 22.9 mmol/L (ref 20.0–28.0)
Bicarbonate: 22.9 mmol/L (ref 20.0–28.0)
Calcium, Ion: 1.24 mmol/L (ref 1.15–1.40)
Calcium, Ion: 1.25 mmol/L (ref 1.15–1.40)
HCT: 39 % (ref 36.0–46.0)
HCT: 39 % (ref 36.0–46.0)
Hemoglobin: 13.3 g/dL (ref 12.0–15.0)
Hemoglobin: 13.3 g/dL (ref 12.0–15.0)
O2 Saturation: 69 %
O2 Saturation: 72 %
Potassium: 4.4 mmol/L (ref 3.5–5.1)
Potassium: 4.4 mmol/L (ref 3.5–5.1)
Sodium: 141 mmol/L (ref 135–145)
Sodium: 141 mmol/L (ref 135–145)
TCO2: 24 mmol/L (ref 22–32)
TCO2: 24 mmol/L (ref 22–32)
pCO2, Ven: 40 mm[Hg] — ABNORMAL LOW (ref 44–60)
pCO2, Ven: 40.4 mm[Hg] — ABNORMAL LOW (ref 44–60)
pH, Ven: 7.36 (ref 7.25–7.43)
pH, Ven: 7.366 (ref 7.25–7.43)
pO2, Ven: 37 mm[Hg] (ref 32–45)
pO2, Ven: 39 mm[Hg] (ref 32–45)

## 2023-09-15 LAB — POCT I-STAT 7, (LYTES, BLD GAS, ICA,H+H)
Acid-base deficit: 3 mmol/L — ABNORMAL HIGH (ref 0.0–2.0)
Bicarbonate: 21.3 mmol/L (ref 20.0–28.0)
Calcium, Ion: 1.22 mmol/L (ref 1.15–1.40)
HCT: 39 % (ref 36.0–46.0)
Hemoglobin: 13.3 g/dL (ref 12.0–15.0)
O2 Saturation: 95 %
Potassium: 4.4 mmol/L (ref 3.5–5.1)
Sodium: 141 mmol/L (ref 135–145)
TCO2: 22 mmol/L (ref 22–32)
pCO2 arterial: 36.6 mm[Hg] (ref 32–48)
pH, Arterial: 7.372 (ref 7.35–7.45)
pO2, Arterial: 78 mm[Hg] — ABNORMAL LOW (ref 83–108)

## 2023-09-15 SURGERY — RIGHT/LEFT HEART CATH AND CORONARY ANGIOGRAPHY
Anesthesia: LOCAL

## 2023-09-15 MED ORDER — IOHEXOL 350 MG/ML SOLN
INTRAVENOUS | Status: DC | PRN
  Administered 2023-09-15: 30 mL

## 2023-09-15 MED ORDER — HEPARIN SODIUM (PORCINE) 1000 UNIT/ML IJ SOLN
INTRAMUSCULAR | Status: DC | PRN
  Administered 2023-09-15: 4000 [IU] via INTRAVENOUS

## 2023-09-15 MED ORDER — LIDOCAINE HCL (PF) 1 % IJ SOLN
INTRAMUSCULAR | Status: DC | PRN
  Administered 2023-09-15: 10 mL

## 2023-09-15 MED ORDER — ASPIRIN 81 MG PO CHEW: 81.0000 mg | CHEWABLE_TABLET | ORAL | Status: DC

## 2023-09-15 MED ORDER — HEPARIN (PORCINE) IN NACL 1000-0.9 UT/500ML-% IV SOLN
INTRAVENOUS | Status: DC | PRN
  Administered 2023-09-15 (×2): 500 mL

## 2023-09-15 MED ORDER — MIDAZOLAM HCL 2 MG/2ML IJ SOLN
INTRAMUSCULAR | Status: DC | PRN
  Administered 2023-09-15: 1 mg via INTRAVENOUS

## 2023-09-15 MED ORDER — VERAPAMIL HCL 2.5 MG/ML IV SOLN
INTRAVENOUS | Status: AC
  Filled 2023-09-15: qty 2

## 2023-09-15 MED ORDER — MIDAZOLAM HCL 2 MG/2ML IJ SOLN
INTRAMUSCULAR | Status: AC
  Filled 2023-09-15: qty 2

## 2023-09-15 MED ORDER — FENTANYL CITRATE (PF) 100 MCG/2ML IJ SOLN
INTRAMUSCULAR | Status: DC | PRN
  Administered 2023-09-15: 25 ug via INTRAVENOUS

## 2023-09-15 MED ORDER — SODIUM CHLORIDE 0.9 % IV SOLN: INTRAVENOUS | Status: DC

## 2023-09-15 MED ORDER — HEPARIN SODIUM (PORCINE) 1000 UNIT/ML IJ SOLN
INTRAMUSCULAR | Status: AC
  Filled 2023-09-15: qty 10

## 2023-09-15 MED ORDER — LIDOCAINE HCL (PF) 1 % IJ SOLN
INTRAMUSCULAR | Status: AC
  Filled 2023-09-15: qty 30

## 2023-09-15 MED ORDER — FENTANYL CITRATE (PF) 100 MCG/2ML IJ SOLN
INTRAMUSCULAR | Status: AC
  Filled 2023-09-15: qty 2

## 2023-09-15 MED ORDER — HEPARIN (PORCINE) IN NACL 2-0.9 UNITS/ML
INTRAMUSCULAR | Status: DC | PRN
  Administered 2023-09-15: 10 mL via INTRA_ARTERIAL

## 2023-09-15 SURGICAL SUPPLY — 12 items
CATH BALLN WEDGE 5F 110CM (CATHETERS) IMPLANT
CATH INFINITI AMBI 5FR TG (CATHETERS) IMPLANT
DEVICE RAD COMP TR BAND LRG (VASCULAR PRODUCTS) IMPLANT
GLIDESHEATH SLEND SS 6F .021 (SHEATH) IMPLANT
GUIDEWIRE .025 260CM (WIRE) IMPLANT
GUIDEWIRE INQWIRE 1.5J.035X260 (WIRE) IMPLANT
INQWIRE 1.5J .035X260CM (WIRE) ×1
PACK CARDIAC CATHETERIZATION (CUSTOM PROCEDURE TRAY) ×1 IMPLANT
SET ATX-X65L (MISCELLANEOUS) IMPLANT
SHEATH GLIDE SLENDER 4/5FR (SHEATH) IMPLANT
SHEATH PINNACLE 7F 10CM (SHEATH) IMPLANT
SHEATH PROBE COVER 6X72 (BAG) IMPLANT

## 2023-09-15 NOTE — Interval H&P Note (Signed)
History and Physical Interval Note:  09/15/2023 10:18 AM  [name]  has presented today for surgery, with the diagnosis of heart failure; CAD-s/p PCI.  The various methods of treatment have been discussed with the patient and family. After consideration of risks, benefits and other options for treatment, the patient has consented to  Procedure(s): RIGHT/LEFT HEART CATH AND CORONARY ANGIOGRAPHY (N/A)  PERCUTANEOUS CORONARY INTERVENTION   as a surgical intervention.  The patient's history has been reviewed, patient examined, no change in status, stable for surgery.  I have reviewed the patient's chart and labs.  Questions were answered to the patient's satisfaction.   Cath Lab Visit (complete for each Cath Lab visit)  Clinical Evaluation Leading to the Procedure:   ACS: No.  Non-ACS:    Anginal Classification: No Symptoms -NYHA CLASS IIb CHF Sx  Anti-ischemic medical therapy: Minimal Therapy (1 class of medications)  Non-Invasive Test Results: Equivocal test results - newly reduced LVEF with ? Prior MI on Myoview (no ischemia)  Prior CABG: No previous CABG      Bryan Lemma

## 2023-09-26 NOTE — Progress Notes (Deleted)
  Cardiology Office Note:  .   Date:  09/26/2023  ID:  Meagan Molina, DOB 09/03/1950, MRN [id number] PCP: Roe Rutherford, NP  [facility] Providers Cardiologist:  None { Click to update primary MD,subspecialty MD or APP then REFRESH:1}   History of Present Illness: .   No chief complaint on file.   Meagan Molina is a 73 y.o. female with history of CHF, CAD, DM, HTN who presents for follow-up.      Problem List CAD -posterior MI 2005 with PCI 2005 -LHC 09/15/2023: patent LCX stent 2. Systolic HF -EF 45-50% 08/24/2023 3. HLD 4. HTN 5. DM -A1c 6.5     ROS: All other ROS reviewed and negative. Pertinent positives noted in the HPI.     Studies Reviewed: Marland Kitchen       TTE 08/24/2023  1. Possible subaortic membrane (see images 33 and 63). Left ventricular  ejection fraction, by estimation, is 45 to 50%. The left ventricle has  mildly decreased function. The left ventricle demonstrates regional wall  motion abnormalities (see scoring  diagram/findings for description). Left ventricular diastolic parameters  are indeterminate. There is hypokinesis of the left ventricular, basal-mid  inferolateral wall and inferior wall.   2. Right ventricular systolic function is normal. The right ventricular  size is normal.   3. The mitral valve is grossly normal. Trivial mitral valve  regurgitation. No evidence of mitral stenosis. Moderate mitral annular  calcification.   4. The aortic valve was not well visualized. There is mild calcification  of the aortic valve. Aortic valve regurgitation is not visualized. Aortic  valve sclerosis is present, with no evidence of aortic valve stenosis.   5. The inferior vena cava is normal in size with greater than 50%  respiratory variability, suggesting right atrial pressure of 3 mmHg.   LHC 09/15/2023    Previously placed Prox Cx to Mid Cx stent of unknown type is  widely patent.  Otherwise minimal CAD.   LV end diastolic pressure is mildly  elevated.   Hemodynamic findings consistent with mild pulmonary hypertension with mean PAP 28 mmHg, and LVEDP 18 mmHg.   There is no aortic valve stenosis. Physical Exam:   VS:  There were no vitals taken for this visit.   Wt Readings from Last 3 Encounters:  09/15/23 183 lb (83 kg)  09/08/23 182 lb (82.6 kg)  08/24/23 193 lb (87.5 kg)    GEN: Well nourished, well developed in no acute distress NECK: No JVD; No carotid bruits CARDIAC: ***RRR, no murmurs, rubs, gallops RESPIRATORY:  Clear to auscultation without rales, wheezing or rhonchi  ABDOMEN: Soft, non-tender, non-distended EXTREMITIES:  No edema; No deformity  ASSESSMENT AND PLAN: .   ***    {Are you ordering a CV Procedure (e.g. stress test, cath, DCCV, TEE, etc)?   Press F2        :[id number]}   Follow-up: No follow-ups on file.  Time Spent with Patient: I have spent a total of *** minutes caring for this patient today face to face, ordering and reviewing labs/tests, reviewing prior records/medical history, examining the patient, establishing an assessment and plan, communicating results/findings to the patient/family, and documenting in the medical record.   Signed, Lenna Gilford. Flora Lipps, MD, [facility]  [facility]  [address], [address] [place], [place] [address] [phone]  7:11 PM

## 2023-09-29 ENCOUNTER — Ambulatory Visit: Payer: Medicare Other | Admitting: Cardiovascular Disease

## 2023-09-29 DIAGNOSIS — I150 Renovascular hypertension: Secondary | ICD-10-CM

## 2023-09-29 DIAGNOSIS — E782 Mixed hyperlipidemia: Secondary | ICD-10-CM

## 2023-09-29 DIAGNOSIS — I5022 Chronic systolic (congestive) heart failure: Secondary | ICD-10-CM

## 2023-09-29 DIAGNOSIS — I2510 Atherosclerotic heart disease of native coronary artery without angina pectoris: Secondary | ICD-10-CM

## 2023-10-04 ENCOUNTER — Ambulatory Visit: Payer: Medicare Other | Admitting: Internal Medicine

## 2023-10-12 NOTE — Progress Notes (Unsigned)
 Cardiology Office Note:  .   Date:  10/13/2023  ID:  Meagan Molina, DOB September 09, 1950, MRN [id number] PCP: Roe Rutherford, NP  [facility] Providers Cardiologist:  None    History of Present Illness: .    Chief Complaint  Patient presents with   Shortness of Breath    Meagan Molina is a 73 y.o. female with history of CAD, CHF, HTN, DM, HLD who presents for follow-up.    History of Present Illness   Meagan Molina is a 73 year old female with coronary artery disease and systolic heart failure who presents for follow-up after heart catheterization.  She underwent a left heart catheterization following an abnormal nuclear medicine stress test. The procedure revealed a patent circumflex stent with no new blockages, but slightly elevated pressures in the heart, indicating fluid retention.  She experiences ongoing shortness of breath, particularly during prolonged walking or standing. Her weight remains stable at 182 pounds. She uses torsemide as needed for leg swelling and plans to adjust to every other day dosing. No chest pain, palpitations, or dizziness.  Her current medications include carvedilol 12.5 mg twice daily, Jardiance 10 mg daily, and lisinopril 10 mg daily. She has concerns about the cost of her medications, particularly Jardiance and the potential switch to [place]. She has a known sulfa allergy and cannot take Aldactone.  She is currently seeing a chiropractor for back issues and is advised to remain active despite her symptoms.          Problem List CAD -posterior MI 2005 with PCI 2005 -LHC 09/15/2023: patent LCX stent 2. Systolic HF -EF 45-50% 08/24/2023 3. HLD -T chol 146, HDL 49, LDL 74, TG 133 4. HTN 5. DM -A1c 6.5    ROS: All other ROS reviewed and negative. Pertinent positives noted in the HPI.     Studies Reviewed: Marland Kitchen       LHC 09/15/2023   Previously placed Prox Cx to Mid Cx stent of unknown type is  widely patent.  Otherwise minimal CAD.    LV end diastolic pressure is mildly elevated.   Hemodynamic findings consistent with mild pulmonary hypertension with mean PAP 28 mmHg, and LVEDP 18 mmHg.   There is no aortic valve stenosis. Physical Exam:   VS:  BP 118/68   Pulse 80   Ht 5\' 4"  (1.626 m)   Wt 182 lb 6.4 oz (82.7 kg)   SpO2 96%   BMI 31.31 kg/m    Wt Readings from Last 3 Encounters:  10/13/23 182 lb 6.4 oz (82.7 kg)  09/15/23 183 lb (83 kg)  09/08/23 182 lb (82.6 kg)    GEN: Well nourished, well developed in no acute distress NECK: No JVD; No carotid bruits CARDIAC: RRR, no murmurs, rubs, gallops RESPIRATORY:  Clear to auscultation without rales, wheezing or rhonchi  ABDOMEN: Soft, non-tender, non-distended EXTREMITIES:  No edema; No deformity  ASSESSMENT AND PLAN: .   Assessment and Plan    Systolic Heart Failure, EF 45-50% Systolic heart failure with EF 45-50%. Elevated LVEDP indicates fluid retention. SOB with exertion suggests fluid overload. Entresto considered for heart relaxation; cost is a concern. Torsemide adjusted for fluid management. - Prescribe torsemide 10 mg every other day. - Consider switching lisinopril to Entresto, pending cost evaluation. - Continue coreg 12.5 mg BID and jardiance 10 mg daily - Encourage regular physical activity, with breaks as needed for SOB. - Follow up with pharmacy regarding the cost of Entresto and potential assistance options.  Coronary Artery Disease (CAD) CAD with prior PCI to circumflex artery. Recent catheterization showed patent stent, no new blockages. LDL at 74 mg/dL, indicating no progression. - Continue aspirin 81 mg daily. - Continue simvastatin 40 mg daily.  Hypertension BP well-controlled on carvedilol and lisinopril. Potential switch to [facility] pending cost evaluation. Entresto may require BP monitoring. - Continue carvedilol 12.5 mg twice daily. - Evaluate the cost of Entresto before switching from lisinopril.           Follow-up: Return in  about 2 months (around 12/13/2023).   Signed, Lenna Gilford. Flora Lipps, MD, [facility]  [facility]  [address] [place], [place] [address] [phone]  1:18 PM

## 2023-10-13 ENCOUNTER — Ambulatory Visit: Attending: Cardiovascular Disease | Admitting: Cardiovascular Disease

## 2023-10-13 ENCOUNTER — Encounter: Payer: Self-pay | Admitting: Cardiovascular Disease

## 2023-10-13 ENCOUNTER — Telehealth: Payer: Self-pay

## 2023-10-13 ENCOUNTER — Other Ambulatory Visit (HOSPITAL_COMMUNITY): Payer: Self-pay

## 2023-10-13 VITALS — BP 118/68 | HR 80 | Ht 64.0 in | Wt 182.4 lb

## 2023-10-13 DIAGNOSIS — E782 Mixed hyperlipidemia: Secondary | ICD-10-CM | POA: Insufficient documentation

## 2023-10-13 DIAGNOSIS — R0602 Shortness of breath: Secondary | ICD-10-CM | POA: Insufficient documentation

## 2023-10-13 DIAGNOSIS — I5022 Chronic systolic (congestive) heart failure: Secondary | ICD-10-CM | POA: Insufficient documentation

## 2023-10-13 DIAGNOSIS — I150 Renovascular hypertension: Secondary | ICD-10-CM | POA: Insufficient documentation

## 2023-10-13 DIAGNOSIS — I2510 Atherosclerotic heart disease of native coronary artery without angina pectoris: Secondary | ICD-10-CM | POA: Insufficient documentation

## 2023-10-13 NOTE — Telephone Encounter (Signed)
 Pharmacy Patient Advocate Encounter   Received notification from Physician's Office that test claim for JARDIANCE is required/requested.   Insurance verification completed.   The patient is insured through  [facility] MEDICARE  .   Per test claim: The current 30 day co-pay is, $559.13.  No PA needed at this time. This test claim was processed through [facility]- copay amounts may vary at other pharmacies due to pharmacy/plan contracts, or as the patient moves through the different stages of their insurance plan.    Received notification from Physician's Office that test claim for ENTRESTO is required/requested.   Insurance verification completed.   The patient is insured through [facility] MEDICARE .   Per test claim: The current 30 day co-pay is, $576.06.  No PA needed at this time. This test claim was processed through [facility]- copay amounts may vary at other pharmacies due to pharmacy/plan contracts, or as the patient moves through the different stages of their insurance plan.

## 2023-10-13 NOTE — Patient Instructions (Signed)
 Medication Instructions:  - START TORSEMIDE 10 MG EVERY OTHER DAY.    *If you need a refill on your cardiac medications before your next appointment, please call your pharmacy*   Lab Work: NONE    If you have labs (blood work) drawn today and your tests are completely normal, you will receive your results only by: MyChart Message (if you have MyChart) OR A paper copy in the mail If you have any lab test that is abnormal or we need to change your treatment, we will call you to review the results.   Testing/Procedures: NONE    Follow-Up: At [facility], you and your health needs are our priority.  As part of our continuing mission to provide you with exceptional heart care, we have created designated Provider Care Teams.  These Care Teams include your primary Cardiologist (physician) and Advanced Practice Providers (APPs -  Physician Assistants and Nurse Practitioners) who all work together to provide you with the care you need, when you need it.  We recommend signing up for the patient portal called "MyChart".  Sign up information is provided on this After Visit Summary.  MyChart is used to connect with patients for Virtual Visits (Telemedicine).  Patients are able to view lab/test results, encounter notes, upcoming appointments, etc.  Non-urgent messages can be sent to your provider as well.   To learn more about what you can do with MyChart, go to [email or web address].    Your next appointment:   2 month(s)  The format for your next appointment:   In Person  Provider:   Edd Fabian, FNP, Micah Flesher, PA-C, Marjie Skiff, PA-C, Robet Leu, PA-C, Juanda Crumble, PA-C, Joni Reining, DNP, ANP, Azalee Course, PA-C, Bernadene Person, NP, or Reather Littler, NP    Then, Lennie Odor, MD   will plan to see you again in 6 month(s).   Other Instructions

## 2023-10-14 ENCOUNTER — Encounter: Payer: Self-pay | Admitting: Cardiovascular Disease

## 2023-10-18 ENCOUNTER — Other Ambulatory Visit (HOSPITAL_COMMUNITY): Payer: Self-pay

## 2023-10-18 ENCOUNTER — Telehealth: Payer: Self-pay

## 2023-10-18 NOTE — Telephone Encounter (Signed)
 Done, please see separate encounter for details

## 2023-10-18 NOTE — Telephone Encounter (Signed)
 Patient Advocate Encounter   The patient was approved for a [employer] grant that will help cover the cost of JARDIANCE AND ENTRESTO Total amount awarded, $10,000.  Effective: 09/18/23 - 09/16/24   [id number] RJJ:[id number]   Pharmacy provided with approval and processing information. Patient informed via Dorcas Carrow, CPhT  Pharmacy Patient Advocate Specialist  Direct Number: [phone] Fax: [phone]

## 2023-10-20 ENCOUNTER — Emergency Department (HOSPITAL_BASED_OUTPATIENT_CLINIC_OR_DEPARTMENT_OTHER)
Admission: EM | Admit: 2023-10-20 | Discharge: 2023-10-20 | Disposition: A | Discharge status: Home / Self Care | Attending: Emergency Medicine | Admitting: Emergency Medicine

## 2023-10-20 ENCOUNTER — Other Ambulatory Visit: Payer: Self-pay

## 2023-10-20 ENCOUNTER — Emergency Department (HOSPITAL_BASED_OUTPATIENT_CLINIC_OR_DEPARTMENT_OTHER): Admitting: Radiology

## 2023-10-20 ENCOUNTER — Encounter (HOSPITAL_BASED_OUTPATIENT_CLINIC_OR_DEPARTMENT_OTHER): Payer: Self-pay

## 2023-10-20 DIAGNOSIS — R5383 Other fatigue: Secondary | ICD-10-CM | POA: Diagnosis not present

## 2023-10-20 DIAGNOSIS — I5020 Unspecified systolic (congestive) heart failure: Secondary | ICD-10-CM | POA: Diagnosis not present

## 2023-10-20 DIAGNOSIS — E860 Dehydration: Secondary | ICD-10-CM

## 2023-10-20 DIAGNOSIS — E039 Hypothyroidism, unspecified: Secondary | ICD-10-CM | POA: Diagnosis not present

## 2023-10-20 DIAGNOSIS — Z7984 Long term (current) use of oral hypoglycemic drugs: Secondary | ICD-10-CM | POA: Insufficient documentation

## 2023-10-20 DIAGNOSIS — E119 Type 2 diabetes mellitus without complications: Secondary | ICD-10-CM | POA: Insufficient documentation

## 2023-10-20 DIAGNOSIS — Z7982 Long term (current) use of aspirin: Secondary | ICD-10-CM | POA: Insufficient documentation

## 2023-10-20 DIAGNOSIS — I2510 Atherosclerotic heart disease of native coronary artery without angina pectoris: Secondary | ICD-10-CM | POA: Diagnosis not present

## 2023-10-20 DIAGNOSIS — I110 Hypertensive heart disease with heart failure: Secondary | ICD-10-CM | POA: Diagnosis not present

## 2023-10-20 DIAGNOSIS — R0602 Shortness of breath: Secondary | ICD-10-CM | POA: Diagnosis present

## 2023-10-20 LAB — BASIC METABOLIC PANEL
Anion gap: 9 (ref 5–15)
BUN: 26 mg/dL — ABNORMAL HIGH (ref 8–23)
CO2: 27 mmol/L (ref 22–32)
Calcium: 9.6 mg/dL (ref 8.9–10.3)
Chloride: 102 mmol/L (ref 98–111)
Creatinine, Ser: 1.18 mg/dL — ABNORMAL HIGH (ref 0.44–1.00)
GFR, Estimated: 48 mL/min — ABNORMAL LOW (ref >60)
Glucose, Bld: 189 mg/dL — ABNORMAL HIGH (ref 70–99)
Potassium: 3.9 mmol/L (ref 3.5–5.1)
Sodium: 138 mmol/L (ref 135–145)

## 2023-10-20 LAB — RESP PANEL BY RT-PCR (RSV, FLU A&B, COVID)  RVPGX2
Influenza A by PCR: NEGATIVE
Influenza B by PCR: NEGATIVE
Resp Syncytial Virus by PCR: NEGATIVE
SARS Coronavirus 2 by RT PCR: NEGATIVE

## 2023-10-20 LAB — CBC WITH DIFFERENTIAL/PLATELET
Abs Immature Granulocytes: 0.03 10*3/uL (ref 0.00–0.07)
Basophils Absolute: 0 10*3/uL (ref 0.0–0.1)
Basophils Relative: 0 %
Eosinophils Absolute: 0.2 10*3/uL (ref 0.0–0.5)
Eosinophils Relative: 3 %
HCT: 46.5 % — ABNORMAL HIGH (ref 36.0–46.0)
Hemoglobin: 15.7 g/dL — ABNORMAL HIGH (ref 12.0–15.0)
Immature Granulocytes: 0 %
Lymphocytes Relative: 27 %
Lymphs Abs: 1.9 10*3/uL (ref 0.7–4.0)
MCH: 30.4 pg (ref 26.0–34.0)
MCHC: 33.8 g/dL (ref 30.0–36.0)
MCV: 89.9 fL (ref 80.0–100.0)
Monocytes Absolute: 0.6 10*3/uL (ref 0.1–1.0)
Monocytes Relative: 8 %
Neutro Abs: 4.3 10*3/uL (ref 1.7–7.7)
Neutrophils Relative %: 62 %
Platelets: 183 10*3/uL (ref 150–400)
RBC: 5.17 MIL/uL — ABNORMAL HIGH (ref 3.87–5.11)
RDW: 13.6 % (ref 11.5–15.5)
WBC: 7.1 10*3/uL (ref 4.0–10.5)
nRBC: 0 % (ref 0.0–0.2)

## 2023-10-20 LAB — BRAIN NATRIURETIC PEPTIDE: B Natriuretic Peptide: 85.9 pg/mL (ref 0.0–100.0)

## 2023-10-20 LAB — D-DIMER, QUANTITATIVE: D-Dimer, Quant: 0.58 ug{FEU}/mL — ABNORMAL HIGH (ref 0.00–0.50)

## 2023-10-20 LAB — TROPONIN I (HIGH SENSITIVITY): Troponin I (High Sensitivity): 6 ng/L (ref <18)

## 2023-10-20 MED ORDER — ALBUTEROL SULFATE HFA 108 (90 BASE) MCG/ACT IN AERS: 2.0000 | INHALATION_SPRAY | RESPIRATORY_TRACT | Status: DC | PRN

## 2023-10-20 MED ORDER — SODIUM CHLORIDE 0.9 % IV BOLUS
250.0000 mL | Freq: Once | INTRAVENOUS | Status: AC
  Administered 2023-10-20: 250 mL via INTRAVENOUS

## 2023-10-20 MED ORDER — ENTRESTO 24-26 MG PO TABS: 1.0000 | ORAL_TABLET | Freq: Two times a day (BID) | ORAL | 3 refills | Status: DC

## 2023-10-20 NOTE — Discharge Instructions (Signed)
 Please follow-up promptly with your cardiologist for further management of your Lasix.  At this time start taking the medication every other day or every 3 days until further management by your primary care doctor or your cardiologist.

## 2023-10-20 NOTE — ED Provider Notes (Addendum)
 [place] [facility]GENC[facility] DE[facility]MENT AT [facility] Provider Note   CSN: [id number] Arrival date & time: 10/20/23  1255     History  Chief Complaint  Patient presents with   Shortness of Breath   Fatigue    Meagan Molina is a 73 y.o. female.  Patient is a 73 year old female with past medical history of hypothyroidism, diabetes, coronary artery disease, systolic heart failure,, hypertension, hyperlipidemia presenting for complaints of shortness of breath.  Patient admits to shortness of breath and chronic cough for the last 5 months.  She states she went to her primary care physician for her symptoms today and was sent for workup in the emergency department due to symptoms in the setting of hypotension with a recorded systolic in the 80s.  On arrival to emergency department the patient's blood pressure is 117/62.  She denies a history of fevers, chills, nausea, vomiting, or diarrhea.  She does admit to shortness of breath and a chronic cough.  She denies any chest pain.  Since her initial symptom onset and November/December 2024. She had a stable right heart cath in February of 2025 after an abnormal nuclear stress test.  She is being managed by cardiologist Dr. Bufford Buttner.  Her symptoms of shortness of breath and lower extremity edema has greatly improved after being managed on Lasix outpatient.  She is still taking Lasix daily at this time.  The history is provided by the patient. No language interpreter was used.  Shortness of Breath Associated symptoms: cough   Associated symptoms: no abdominal pain, no chest pain, no ear pain, no fever, no rash, no sore throat and no vomiting        Home Medications Prior to Admission medications   Medication Sig Start Date End Date Taking? Authorizing Provider  ACCU-CHEK AVIVA PLUS test strip 1 each by Other route daily. E11.65 06/11/20   Wilson Singer, MD  aspirin EC 81 MG tablet Take 81 mg by mouth daily.    [provider]   carvedilol (COREG) 12.5 MG tablet Take 1 tablet (12.5 mg total) by mouth 2 (two) times daily with a meal. 05/14/20   Gosrani, Nimish C, MD  clobetasol (TEMOVATE) 0.05 % external solution Apply 1 application topically 2 (two) times daily as needed. Itchy scalp 11/27/20   Wilson Singer, MD  empagliflozin (JARDIANCE) 10 MG TABS tablet Take 1 tablet (10 mg total) by mouth daily before breakfast. 07/27/23   O'Neal, Ronnald Ramp, MD  glipiZIDE (GLUCOTROL XL) 5 MG 24 hr tablet Take 5 mg by mouth daily with breakfast.    [provider]  glucose blood (ACCU-CHEK AVIVA PLUS) test strip Use to test blood sugar once a day (Dx:  E11.65) 05/02/19   [provider]  glucose blood test strip Accu-Chek Aviva Plus test strips  USE 1 STRIP TO CHECK GLUCOSE ONCE DAILY    [provider]  levothyroxine (SYNTHROID) 88 MCG tablet Take 88 mcg by mouth daily before breakfast. 07/12/23   [provider]  meclizine (ANTIVERT) 12.5 MG tablet Take 1 tablet (12.5 mg total) by mouth 3 (three) times daily as needed for dizziness. 11/27/20   Wilson Singer, MD  Misc. Devices MISC daily. 07/27/23   [provider]  potassium chloride (KLOR-CON M) 10 MEQ tablet Take 10 mEq by mouth daily. 07/23/23   [provider]  sacubitril-valsartan (ENTRESTO) 24-26 MG Take 1 tablet by mouth 2 (two) times daily. 10/20/23   O'NealRonnald Ramp, MD  simvastatin (  ZOCOR) 40 MG tablet Take 1 tablet (40 mg total) by mouth every evening. 02/03/21   Lilly Cove C, MD  torsemide (DEMADEX) 20 MG tablet Take 20 mg by mouth daily as needed (swelling).    [provider]      Allergies    Glyxambi [empagliflozin-linagliptin], Invokana [canagliflozin], Red dye #40 (allura red), Sulfa antibiotics, Tradjenta [linagliptin], Victoza [liraglutide], and Sulfamethoxazole    Review of Systems   Review of Systems  Constitutional:  Negative for chills and fever.  HENT:  Negative for ear pain  and sore throat.   Eyes:  Negative for pain and visual disturbance.  Respiratory:  Positive for cough and shortness of breath.   Cardiovascular:  Negative for chest pain and palpitations.  Gastrointestinal:  Negative for abdominal pain and vomiting.  Genitourinary:  Negative for dysuria and hematuria.  Musculoskeletal:  Negative for arthralgias and back pain.  Skin:  Negative for color change and rash.  Neurological:  Negative for seizures and syncope.  All other systems reviewed and are negative.   Physical Exam Updated Vital Signs BP 117/62   Pulse 69   Temp 98.8 F (37.1 C)   Resp 14   Ht 5\' 4"  (1.626 m)   Wt 81.2 kg   SpO2 98%   BMI 30.73 kg/m  Physical Exam Vitals and nursing note reviewed.  Constitutional:      General: She is not in acute distress.    Appearance: She is well-developed.  HENT:     Head: Normocephalic and atraumatic.     Mouth/Throat:     Mouth: Mucous membranes are dry.  Eyes:     Conjunctiva/sclera: Conjunctivae normal.  Cardiovascular:     Rate and Rhythm: Normal rate and regular rhythm.     Pulses:          Dorsalis pedis pulses are 2+ on the right side and 2+ on the left side.     Heart sounds: No murmur heard. Pulmonary:     Effort: Pulmonary effort is normal. No respiratory distress.     Breath sounds: Normal breath sounds.  Abdominal:     Palpations: Abdomen is soft.     Tenderness: There is no abdominal tenderness.  Musculoskeletal:        General: No swelling.     Cervical back: Neck supple.     Right lower leg: No edema.     Left lower leg: No edema.  Skin:    General: Skin is warm and dry.     Capillary Refill: Capillary refill takes less than 2 seconds.     Comments: Skin tenting present  Neurological:     Mental Status: She is alert.  Psychiatric:        Mood and Affect: Mood normal.     ED Results / Procedures / Treatments   Labs (all labs ordered are listed, but only abnormal results are displayed) Labs Reviewed   CBC WITH DIFFERENTIAL/PLATELET - Abnormal; Notable for the following components:      Result Value   RBC 5.17 (*)    Hemoglobin 15.7 (*)    HCT 46.5 (*)    All other components within normal limits  BASIC METABOLIC PANEL - Abnormal; Notable for the following components:   Glucose, Bld 189 (*)    BUN 26 (*)    Creatinine, Ser 1.18 (*)    GFR, Estimated 48 (*)    All other components within normal limits  D-DIMER, QUANTITATIVE - Abnormal; Notable for the  following components:   D-Dimer, Quant 0.58 (*)    All other components within normal limits  RESP PANEL BY RT-PCR (RSV, FLU A&B, COVID)  RVPGX2  BRAIN NATRIURETIC PEPTIDE  TROPONIN I (HIGH SENSITIVITY)  TROPONIN I (HIGH SENSITIVITY)    EKG None  Radiology DG Chest 2 View Result Date: 10/20/2023 CLINICAL DATA:  Shortness of breath. EXAM: CHEST - 2 VIEW COMPARISON:  February 04, 2012 FINDINGS: The heart size and mediastinal contours are within normal limits. Lung volumes are noted. Very mild atelectasis is seen within the left lung base. No acute infiltrate, pleural effusion or pneumothorax is identified. The visualized skeletal structures are unremarkable. IMPRESSION: No active cardiopulmonary disease. Electronically Signed   By: Aram Candela M.D.   On: 10/20/2023 14:47    Procedures Procedures    Medications Ordered in ED Medications  albuterol (VENTOLIN HFA) 108 (90 Base) MCG/ACT inhaler 2 puff (has no administration in time range)  sodium chloride 0.9 % bolus 250 mL (250 mLs Intravenous New Bag/Given 10/20/23 1516)    ED Course/ Medical Decision Making/ A&P                                 Medical Decision Making Amount and/or Complexity of Data Reviewed Labs: ordered. Radiology: ordered.  Risk Prescription drug management.   74:61 PM 73 year old female with past medical history of hypothyroidism, diabetes, coronary artery disease, CHF, hypertension, hyperlipidemia presenting for complaints of shortness of  breath.  Sent by PCP office for hypotension. please read HPI for further details.  On arrival patient is alert and oriented x 3, no acute distress, afebrile, stable vital signs.  Blood pressure 112/76.  She is equal bilateral breath sounds with no adventitious lung sounds.  No lower extremity pitting edema.  No signs of fluid overloaded state on physical exam.  She appears to be somewhat fluid down at this time due to hypotension, skin tenting, and dry mucous membranes.  ECG demonstrates sinus rhythm.  No ST segment elevation changes concerning for myocardial ischemia.  Cardiac markers are pending.  CBC and BMP are within normal limits.  Stable electrolytes.  Hemoglobin elevated further concerning for dehydration.  Discerns for dehydration due to dry mouth, skin tenting, hypotension, AKI, and elevated hemoglobin levels.  Patient given small 200 cc fluid bolus and recommended to decrease Lasix to every other day with prompt follow-up with cardiology.  It is my understanding that the patient had a pericardial effusion.  I do not want to remove her Lasix and make that worse however she has no lower extremity edema, no pleural effusions, and otherwise appears dry on exam.      Final Clinical Impression(s) / ED Diagnoses Final diagnoses:  None    Rx / DC Orders ED Discharge Orders     None         Franne Forts, DO 10/20/23 1450    Edwin Dada P, DO 10/20/23 1450    Edwin Dada P, DO 10/20/23 1527

## 2023-10-20 NOTE — ED Triage Notes (Signed)
 Patient arrives POV from her PCP office with complaints of shortness of breath and low BP (80's systolic). Patients provider was concerned that she may be having a CHF exacerbation.

## 2023-10-26 ENCOUNTER — Ambulatory Visit: Admitting: Cardiovascular Disease

## 2023-10-29 ENCOUNTER — Ambulatory Visit: Admitting: Cardiovascular Disease

## 2023-10-31 NOTE — Progress Notes (Unsigned)
 Cardiology Office Note:  .   Date:  11/01/2023  ID:  Meagan Molina, DOB 31-Aug-1950, MRN [id number] PCP: Roe Rutherford, NP  [facility] Providers Cardiologist:  None { History of Present Illness: .    Chief Complaint  Patient presents with   Follow-up    Meagan Molina is a 73 y.o. female with history of CHF who presents for hospital follow-up. Seen late March for low BP.    History of Present Illness   Meagan Molina is a 73 year old female with systolic heart failure who presents for follow-up after an ER visit for low blood pressure. She was advised by her sister to contact her primary care provider, which led to the ER visit.  She experienced weakness, fatigue, and felt 'almost out of it,' with her sister noticing altered responsiveness. In the ER, it was determined that she might have been over-diuresed, leading to low blood pressure. She received fluids, which improved her condition.  Her current blood pressure is 88/64 mmHg, measured at home, and she continues to feel tired. Her medication regimen includes Entresto, Jardiance, and carvedilol, taken twice daily. She takes torsemide only as needed for swelling and potassium only when taking torsemide. She is allergic to sulfa drugs and was previously on lisinopril, which was stopped due to a chronic cough. She has been on carvedilol for a long time and is considering switching to metoprolol.  She has a chronic cough that has persisted since last year, which she associates with prior lisinopril use. No chest pain, but ongoing breathing difficulties and a persistent cough are reported.  Her weight has decreased by 20 pounds since December, with no signs of volume overload. She continues to take aspirin and simvastatin for coronary artery disease, with recent cholesterol levels close to goal.  She is diabetic with an A1c of 6.5%, indicating good glycemic control.          Problem List CAD -posterior MI 2005 with PCI  2005 -LHC 09/15/2023: patent LCX stent 2. Systolic HF -EF 45-50% 08/24/2023 3. HLD -T chol 146, HDL 49, LDL 74, TG 133 4. HTN 5. DM -A1c 6.5    ROS: All other ROS reviewed and negative. Pertinent positives noted in the HPI.     Studies Reviewed: Marland Kitchen       LHC 09/15/2023   Previously placed Prox Cx to Mid Cx stent of unknown type is  widely patent.  Otherwise minimal CAD.   LV end diastolic pressure is mildly elevated.   Hemodynamic findings consistent with mild pulmonary hypertension with mean PAP 28 mmHg, and LVEDP 18 mmHg.   There is no aortic valve stenosis. Physical Exam:   VS:  BP 99/67   Pulse 78   Ht 5\' 4"  (1.626 m)   Wt 175 lb (79.4 kg)   SpO2 97%   BMI 30.04 kg/m    Wt Readings from Last 3 Encounters:  11/01/23 175 lb (79.4 kg)  10/20/23 179 lb (81.2 kg)  10/13/23 182 lb 6.4 oz (82.7 kg)    GEN: Well nourished, well developed in no acute distress NECK: No JVD; No carotid bruits CARDIAC: RRR, no murmurs, rubs, gallops RESPIRATORY:  Clear to auscultation without rales, wheezing or rhonchi  ABDOMEN: Soft, non-tender, non-distended EXTREMITIES:  No edema; No deformity  ASSESSMENT AND PLAN: .   Assessment and Plan    Systolic Heart Failure, EF 45-50% Follow-up after ER visit for hypotension and dizziness, likely due to over-diuresis. Ejection fraction 45-50%.  Carvedilol contributing to hypotension. No volume overload. Weight decreased by 20 pounds. Fatigue and low energy possibly medication-related. - Discontinue carvedilol, start metoprolol succinate 12.5 mg at night. - Continue Entresto 24/26 mg twice daily. - Continue Jardiance 10 mg daily. - Use torsemide 20 mg and potassium as needed for edema. - Monitor blood pressure at home, report if systolic BP <100 mmHg. - Follow-up with PA in one month.  Coronary Artery Disease (CAD) CAD with patent circumflex stent. LDL 74 mg/dL, near target. On aspirin and simvastatin. - Continue aspirin 81 mg daily. - Continue  simvastatin 40 mg daily.              Follow-up: Return in about 4 months (around 03/02/2024).   Signed, Lenna Gilford. Flora Lipps, MD, [facility]  [facility]  [address], [address] [place], [place] [address] [phone]  3:36 PM

## 2023-11-01 ENCOUNTER — Encounter: Payer: Self-pay | Admitting: Cardiovascular Disease

## 2023-11-01 ENCOUNTER — Ambulatory Visit: Attending: Cardiovascular Disease | Admitting: Cardiovascular Disease

## 2023-11-01 VITALS — BP 99/67 | HR 78 | Ht 64.0 in | Wt 175.0 lb

## 2023-11-01 DIAGNOSIS — I150 Renovascular hypertension: Secondary | ICD-10-CM | POA: Diagnosis present

## 2023-11-01 DIAGNOSIS — I2510 Atherosclerotic heart disease of native coronary artery without angina pectoris: Secondary | ICD-10-CM | POA: Diagnosis present

## 2023-11-01 DIAGNOSIS — R0602 Shortness of breath: Secondary | ICD-10-CM | POA: Diagnosis present

## 2023-11-01 DIAGNOSIS — I5022 Chronic systolic (congestive) heart failure: Secondary | ICD-10-CM | POA: Insufficient documentation

## 2023-11-01 DIAGNOSIS — E782 Mixed hyperlipidemia: Secondary | ICD-10-CM | POA: Diagnosis present

## 2023-11-01 MED ORDER — METOPROLOL SUCCINATE ER 25 MG PO TB24
12.5000 mg | ORAL_TABLET | Freq: Every day | ORAL | 1 refills | Status: AC
Start: 2023-11-01 — End: ?

## 2023-11-01 NOTE — Patient Instructions (Addendum)
 Medication Instructions:  - STOP CARVEDILOL - START Metoprolol Succinate 12.5 MG BY MOUTH AT NIGHT   - Take TORSEMIDE and KLORCON AS NEEDED FOR SWELLING    *If you need a refill on your cardiac medications before your next appointment, please call your pharmacy*   Lab Work: NONE    If you have labs (blood work) drawn today and your tests are completely normal, you will receive your results only by: MyChart Message (if you have MyChart) OR A paper copy in the mail If you have any lab test that is abnormal or we need to change your treatment, we will call you to review the results.   Testing/Procedures: NONE    Follow-Up: At [facility], you and your health needs are our priority.  As part of our continuing mission to provide you with exceptional heart care, we have created designated Provider Care Teams.  These Care Teams include your primary Cardiologist (physician) and Advanced Practice Providers (APPs -  Physician Assistants and Nurse Practitioners) who all work together to provide you with the care you need, when you need it.  We recommend signing up for the patient portal called "MyChart".  Sign up information is provided on this After Visit Summary.  MyChart is used to connect with patients for Virtual Visits (Telemedicine).  Patients are able to view lab/test results, encounter notes, upcoming appointments, etc.  Non-urgent messages can be sent to your provider as well.   To learn more about what you can do with MyChart, go to [email or web address].    Your next appointment:   4 month(s)  The format for your next appointment:   In Person  Provider:   Lennie Odor, MD     Other Instructions

## 2023-12-01 ENCOUNTER — Ambulatory Visit: Attending: Emergency Medicine | Admitting: Emergency Medicine

## 2023-12-01 ENCOUNTER — Encounter: Payer: Self-pay | Admitting: Emergency Medicine

## 2023-12-01 VITALS — BP 110/64 | HR 55 | Ht 64.0 in | Wt 174.0 lb

## 2023-12-01 DIAGNOSIS — I5022 Chronic systolic (congestive) heart failure: Secondary | ICD-10-CM | POA: Diagnosis present

## 2023-12-01 DIAGNOSIS — I10 Essential (primary) hypertension: Secondary | ICD-10-CM | POA: Diagnosis present

## 2023-12-01 DIAGNOSIS — I2510 Atherosclerotic heart disease of native coronary artery without angina pectoris: Secondary | ICD-10-CM | POA: Insufficient documentation

## 2023-12-01 DIAGNOSIS — R0602 Shortness of breath: Secondary | ICD-10-CM | POA: Diagnosis present

## 2023-12-01 DIAGNOSIS — E782 Mixed hyperlipidemia: Secondary | ICD-10-CM | POA: Diagnosis present

## 2023-12-01 NOTE — Progress Notes (Unsigned)
 Cardiology Office Note:    Date:  12/02/2023  ID:  Meagan Molina, DOB [birth year], MRN [id number] PCP: Lorre Rosin, NP  [facility] Providers Cardiologist:  Oneil Bigness, MD       Patient Profile:      Chief Complaint: 1 month follow-up for hypotension, shortness of breath, and dizziness History of Present Illness:  Meagan Molina is a 73 y.o. female with visit-pertinent history of systolic heart failure, coronary artery disease  Has history of coronary artery disease with posterior MI in 2005 s/p PCI.   Echocardiogram 08/24/2023 showed LVEF 45 to 50%, RWMA, RV function and size normal, trivial MR, moderate MAC.  Lexiscan  stress test 07/2023 was consistent with infarction and no ischemia.  Cardiac catheterization 09/15/2023 showed previously placed proximal LCx to mid LCx stent of unknown type was widely patent.  Otherwise minimal CAD.  Mild secondary pulmonary hypertension with mean PAP 28 mmHg, PCWP 9 mm but LVEDP of 18 mmHg.  She was seen in clinic on 10/13/2023.  Her cardiac cath showed elevated LVEDP which indicated fluid retention.  She was experiencing some shortness of breath with exertion suggesting some fluid overload.  She was prescribed torsemide 10 mg every other day.  She was started on Entresto .  She was last seen in clinic on 11/01/2023.  She was seen for follow-up after ER visit for hypotension and dizziness, likely due to overdiuresis.  Ejection fraction was 45-50%.  Carvedilol  likely contributing to hypotension.  She was not volume overloaded on exam.  Weight was decreased by 20 pounds.  Her carvedilol  was discontinued and she was started on metoprolol  succinate 12.5 mg at night.  She was continued on Entresto  24/26 mg twice daily and Jardiance  10 mg daily.  She was to use her torsemide 20 mg as needed instead of every other day.   Discussed the use of AI scribe software for clinical note transcription with the patient, who gave verbal consent to proceed.  History  of Present Illness Meagan Molina is a 73 year old female with coronary artery disease who presents for 1 month follow-up.  Patient comes into the office today noting she feels well overall.  She has been without any hypotension or lightheadedness/dizziness since her last office visit.  Her lightheadedness has entirely resolved.  She notes she has had no issues with her new medication regimen.  She notes that she has not been taking her blood pressure at home because she has been asymptomatic.  She notes she only takes her blood pressure at home when she starts to feel bad.  She does continue to experience ongoing shortness of breath, particularly in the mornings.  She notes every morning she wakes up and will experience some intermittent shortness of breath around 5 to 10 minutes.  She notes the shortness of breath then resolves and she does not experience shortness of breath for the remainder of the day.  However she does have dry cough for the remainder of the day.  She notes that she was found to have thyroid  nodules with 1 thyroid  nodule possibly pushing on her esophagus causing the shortness of breath.  She does have biopsy later this month.  She notes that she has had an episode of blood-tinged sputum over the past year.  She denies smoking history. 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  She remains without any chest pain or exertional symptoms.  She denies  any DOE, orthopnea, PND, leg swelling, palpitations.   Review of systems:  Please see the history of present illness. All other systems are reviewed and otherwise negative.     Home Medications:    Current Meds  Medication Sig   ACCU-CHEK AVIVA PLUS test strip 1 each by Other route daily. E11.65   aspirin  EC 81 MG tablet Take 81 mg by mouth daily.   clobetasol  (TEMOVATE ) 0.05 % external solution Apply 1 application topically 2 (two) times daily as needed. Itchy scalp    empagliflozin  (JARDIANCE ) 10 MG TABS tablet Take 1 tablet (10 mg total) by mouth daily before breakfast.   famotidine (PEPCID) 20 MG tablet Take 20 mg by mouth 2 (two) times daily.   glipiZIDE  (GLUCOTROL  XL) 5 MG 24 hr tablet Take 5 mg by mouth daily with breakfast.   glucose blood (ACCU-CHEK AVIVA PLUS) test strip Use to test blood sugar once a day (Dx:  E11.65)   glucose blood test strip Accu-Chek Aviva Plus test strips  USE 1 STRIP TO CHECK GLUCOSE ONCE DAILY   levothyroxine (SYNTHROID) 88 MCG tablet Take 88 mcg by mouth daily before breakfast.   meclizine  (ANTIVERT ) 12.5 MG tablet Take 1 tablet (12.5 mg total) by mouth 3 (three) times daily as needed for dizziness.   metoprolol  succinate (TOPROL  XL) 25 MG 24 hr tablet Take 0.5 tablets (12.5 mg total) by mouth daily.   Misc. Devices MISC daily.   potassium chloride (KLOR-CON M) 10 MEQ tablet Take 10 mEq by mouth daily.   sacubitril-valsartan (ENTRESTO ) 24-26 MG Take 1 tablet by mouth 2 (two) times daily.   simvastatin  (ZOCOR ) 40 MG tablet Take 1 tablet (40 mg total) by mouth every evening.   torsemide (DEMADEX) 20 MG tablet Take 20 mg by mouth daily as needed (swelling).   Studies Reviewed:       Echocardiogram 08/24/2023 1. Possible subaortic membrane (see images 33 and 63). Left ventricular  ejection fraction, by estimation, is 45 to 50%. The left ventricle has  mildly decreased function. The left ventricle demonstrates regional wall  motion abnormalities (see scoring  diagram/findings for description). Left ventricular diastolic parameters  are indeterminate. There is hypokinesis of the left ventricular, basal-mid  inferolateral wall and inferior wall.   2. Right ventricular systolic function is normal. The right ventricular  size is normal.   3. The mitral valve is grossly normal. Trivial mitral valve  regurgitation. No evidence of mitral stenosis. Moderate mitral annular  calcification.   4. The aortic valve was not well  visualized. There is mild calcification  of the aortic valve. Aortic valve regurgitation is not visualized. Aortic  valve sclerosis is present, with no evidence of aortic valve stenosis.   5. The inferior vena cava is normal in size with greater than 50%  respiratory variability, suggesting right atrial pressure of 3 mmHg.   Lexiscan  Myoview  08/24/2023   Findings are consistent with infarction and no ischemia. The study is intermediate risk due to reduced systolic function.   No ST deviation was noted.   LV perfusion is abnormal. There is no evidence of ischemia. There is evidence of infarction. Defect 1: There is a small defect with moderate reduction in uptake present in the mid to basal inferolateral location(s) that is fixed. There is abnormal wall motion in the defect area. Consistent with infarction.   Left ventricular function is abnormal. Global function is moderately reduced. Nuclear stress EF: 43%. The left ventricular ejection fraction is moderately decreased (30-44%). End  diastolic cavity size is normal. End systolic cavity size is normal.  R/L cardiac catheterization 09/15/2023   Previously placed Prox Cx to Mid Cx stent of unknown type is  widely patent.  Otherwise minimal CAD.   LV end diastolic pressure is mildly elevated.   Hemodynamic findings consistent with mild pulmonary hypertension with mean PAP 28 mmHg, and LVEDP 18 mmHg.   There is no aortic valve stenosis. Diagnostic Dominance: Right  Risk Assessment/Calculations:             Physical Exam:   VS:  BP 110/64   Pulse (!) 55   Ht 5\' 4"  (1.626 m)   Wt 174 lb (78.9 kg)   SpO2 99%   BMI 29.87 kg/m    Wt Readings from Last 3 Encounters:  12/01/23 174 lb (78.9 kg)  11/01/23 175 lb (79.4 kg)  10/20/23 179 lb (81.2 kg)    GEN: Well nourished, well developed in no acute distress NECK: No JVD; No carotid bruits CARDIAC: RRR, no murmurs, rubs, gallops RESPIRATORY:  Clear to auscultation without rales, wheezing or  rhonchi  ABDOMEN: Soft, non-tender, non-distended EXTREMITIES:  No edema; No acute deformity     Assessment and Plan:  Systolic heart failure Echocardiogram 07/2023 with LVEF 45-50% [facility] 08/2023 showing previously placed proximal Cx to mid Cx stent of unknown type is widely patent, otherwise minimal CAD She previously experienced hypotension/lightheadedness/dizziness in the setting of over-diuresis.   Carvedilol  was discontinued and she was subsequently started on metoprolol  XL - Over the past month she has had no episodes of hypotension.  She is without any further lightheadedness or dizziness. She has not had to use her loop diuretic - Today patient is euvolemic and well compensated on exam - Continue Jardiance  10 mg daily, Toprol -XL 12.5 mg daily, Entresto  24-26 mg twice daily, torsemide as needed - BMET today  Coronary artery disease [facility] 08/2023 showed previously placed proximal Cx to mid CX stent of unknown type is widely patent, otherwise minimal CAD - Today patient is without any anginal symptoms, no indication for further ischemic evaluation at this time - Continue aspirin  81 mg daily and simvastatin  40 mg daily  Hyperlipidemia LDL 74, HDL 49, TG 133, TC 146 on 07/6107 LDL slightly above goal of less than 70 She will work on diet and exercise in order to reach her goal - Can consider repeating fasting lipid panel at follow-up visit - Continue simvastatin  40 mg daily  Hypertension Blood pressure today is 110/64 and under adequate control She denies any hypotensive episodes over the past month - Continue current antihypertensive regimen - Recommended to monitor BP daily at home  Shortness of breath Notes shortness of breath has been persistent for a little over a year.  She denies any worsening of her shortness of breath.  Symptoms occur daily only in the morning time for around 5 to 10 minutes at a time.  She does not experience any SOB for the rest of the day.  She does have  ongoing dry cough.  She was recently found to have several thyroid  nodules with 1 nodule possibly pressing on her esophagus.  She has biopsy later this month.  Given her reassuring symptoms without DOE, orthopnea, PND, leg swelling, weight gain, her SOB is likely not cardiac in origin.       Dispo:  Return in about 3 months (around 03/02/2024).  Signed, Ava Boatman, NP

## 2023-12-01 NOTE — Patient Instructions (Addendum)
 Medication Instructions:  NO CHANGES   Lab Work: BMET  Testing/Procedures: NONE  Follow-Up: At [facility], you and your health needs are our priority.  As part of our continuing mission to provide you with exceptional heart care, our providers are all part of one team.  This team includes your primary Cardiologist (physician) and Advanced Practice Providers or APPs (Physician Assistants and Nurse Practitioners) who all work together to provide you with the care you need, when you need it.  Your next appointment:   KEEP AUGUST APPOINTMENT WITH DR. O'NEAL   Provider:   DR. Rolm Clos, MD

## 2023-12-02 ENCOUNTER — Encounter: Payer: Self-pay | Admitting: Emergency Medicine

## 2023-12-02 LAB — BASIC METABOLIC PANEL WITH GFR
BUN/Creatinine Ratio: 14 (ref 12–28)
BUN: 15 mg/dL (ref 8–27)
CO2: 21 mmol/L (ref 20–29)
Calcium: 9.7 mg/dL (ref 8.7–10.3)
Chloride: 103 mmol/L (ref 96–106)
Creatinine, Ser: 1.08 mg/dL — ABNORMAL HIGH (ref 0.57–1.00)
Glucose: 169 mg/dL — ABNORMAL HIGH (ref 70–99)
Potassium: 4.6 mmol/L (ref 3.5–5.2)
Sodium: 142 mmol/L (ref 134–144)
eGFR: 54 mL/min/{1.73_m2} — ABNORMAL LOW (ref >59)

## 2023-12-29 ENCOUNTER — Encounter (INDEPENDENT_AMBULATORY_CARE_PROVIDER_SITE_OTHER): Payer: Self-pay | Admitting: Otolaryngology

## 2024-01-19 NOTE — Progress Notes (Signed)
 Diabetic foot exam:  Left: Monofilament test: Sensation normal  Pulses: normal and present  Skin: Normal and no erythema, no cyanosis or pallor   Other findings: none Right: Monofilament test: Sensation normal  Pulses: normal and present  Skin: Normal and no erythema, no cyanosis or pallor   Other findings: none Exam performed with shoes and socks removed.

## 2024-02-07 ENCOUNTER — Ambulatory Visit (INDEPENDENT_AMBULATORY_CARE_PROVIDER_SITE_OTHER): Admitting: Otolaryngology

## 2024-02-07 VITALS — BP 135/78 | HR 94

## 2024-02-07 DIAGNOSIS — K219 Gastro-esophageal reflux disease without esophagitis: Secondary | ICD-10-CM | POA: Diagnosis not present

## 2024-02-07 DIAGNOSIS — R490 Dysphonia: Secondary | ICD-10-CM | POA: Diagnosis not present

## 2024-02-07 DIAGNOSIS — R0981 Nasal congestion: Secondary | ICD-10-CM

## 2024-02-07 DIAGNOSIS — R09A2 Foreign body sensation, throat: Secondary | ICD-10-CM | POA: Diagnosis not present

## 2024-02-07 DIAGNOSIS — J3089 Other allergic rhinitis: Secondary | ICD-10-CM

## 2024-02-07 DIAGNOSIS — R0982 Postnasal drip: Secondary | ICD-10-CM

## 2024-02-07 DIAGNOSIS — J383 Other diseases of vocal cords: Secondary | ICD-10-CM | POA: Diagnosis not present

## 2024-02-07 DIAGNOSIS — E042 Nontoxic multinodular goiter: Secondary | ICD-10-CM

## 2024-02-07 NOTE — Progress Notes (Signed)
 ENT CONSULT:  Reason for Consult: hoarseness and globus sensation    HPI: Discussed the use of AI scribe software for clinical note transcription with the patient, who gave verbal consent to proceed.  History of Present Illness Meagan Molina is a 73 year old female who presents with hoarseness and sensation of a foreign body in the throat.  She has experienced hoarseness and a sensation of having something stuck in her throat for over a year, with progressive worsening. No preceding illness or event has been identified as a trigger for these symptoms.  She has a history of allergies but denies postnasal drainage, nasal congestion, or any head and neck surgeries, including vocal cord surgeries. She has never smoked.  She has been prescribed two different medications for reflux but has not experienced improvement in her symptoms. She continues to take both reflux medications.  A chest x-ray was performed in March and it was normal. An ultrasound of the thyroid  conducted in April revealed nodules and she had FNA but the results are not available for review.   No trouble with swallowing.   Records Reviewed:  Seen at [facility] for Hoarseness on 12/23/23    Past Medical History:  Diagnosis Date   Allergic rhinitis, unspecified    Anxiety disorder, unspecified    Atherosclerotic heart disease of native coronary artery without angina pectoris    Cataract    Coronary artery disease    cardiac catheterization 03/01/12, 11/14/03, 10/10/2003, 02/21/1999, 08/14/1985   Diabetes mellitus (HCC)    non-insulin dependent   Essential (primary) hypertension    Fibromyalgia    Fibromyalgia    Hyperlipidemia, mixed    Hyperlipidemia, unspecified    Hypertension    Hypertensive heart disease without heart failure    Hypothyroidism    Hypothyroidism, unspecified    Major depressive disorder, single episode, moderate (HCC)    Other chest pain    Other muscle spasm    Primary insomnia    Shortness of  breath    2D Echocardiogram 02/03/12 with EF of greater than 55%   Shortness of breath    Type 2 diabetes mellitus with hyperglycemia (HCC)    Type 2 diabetes mellitus with hyperglycemia (HCC)     Past Surgical History:  Procedure Laterality Date   ABDOMINAL HYSTERECTOMY     TAH&BSO Benign mass 1987   BREAST BIOPSY     BREAST SURGERY     CARDIAC CATHETERIZATION  11/14/2003, 10/10/2003   2.25x96mm cutting balloon performed in mid left anterior descending, stenosis reduced from 80% to 0% with TIMI-3 to TIMI-3 flow maintained.   On 10/10/03 percutaneous angioplasty and stenting of proximal circumflex coronary artery with 3.5x74mm Cypher stent, post dilated with 3.5x42mm PowerSail at 16 atmospheric pressure maximum, stenosis reduced  from 99% to 0% with TIMI-2 to 3 flow maintained    EYE SURGERY     LEFT HEART CATHETERIZATION WITH CORONARY ANGIOGRAM N/A 03/01/2012   Procedure: LEFT HEART CATHETERIZATION WITH CORONARY ANGIOGRAM;  Surgeon: Dorn JINNY Lesches, MD;  Location: [facility] CATH LAB;  Service: Cardiovascular;  Laterality: N/A;   RIGHT/LEFT HEART CATH AND CORONARY ANGIOGRAPHY N/A 09/15/2023   Procedure: RIGHT/LEFT HEART CATH AND CORONARY ANGIOGRAPHY;  Surgeon: Anner Alm ORN, MD;  Location: [facility] INVASIVE CV LAB;  Service: Cardiovascular;  Laterality: N/A;   salpingo-oophorectomy comp/prtl uni/bi spx      Family History  Problem Relation Age of Onset   Cancer Mother        breast cancer deceased in her  50's   Coronary artery disease Father        myocardial infarction 09/2003   Pneumonia Brother     Social History:  reports that she has never smoked. She has never used smokeless tobacco. She reports that she does not drink alcohol and does not use drugs.  Allergies:  Allergies  Allergen Reactions   Glyxambi [Empagliflozin -Linagliptin] Nausea And Vomiting   Invokana [Canagliflozin] Nausea And Vomiting   Red Dye #40 (Allura Red)     Per Allergy Test    Sulfa Antibiotics Other (See  Comments)    unknown   Tradjenta [Linagliptin] Nausea And Vomiting   Victoza [Liraglutide] Nausea And Vomiting   Sulfamethoxazole Rash    Medications: I have reviewed the patient's current medications.  The PMH, PSH, Medications, Allergies, and SH were reviewed and updated.  ROS: Constitutional: Negative for fever, weight loss and weight gain. Cardiovascular: Negative for chest pain and dyspnea on exertion. Respiratory: Is not experiencing shortness of breath at rest. Gastrointestinal: Negative for nausea and vomiting. Neurological: Negative for headaches. Psychiatric: The patient is not nervous/anxious  Blood pressure 135/78, pulse 94, SpO2 97%. There is no height or weight on file to calculate BMI.  PHYSICAL EXAM:  Exam: General: Well-developed, well-nourished Communication and Voice: raspy Respiratory Respiratory effort: Equal inspiration and expiration without stridor Cardiovascular Peripheral Vascular: Warm extremities with equal color/perfusion Eyes: No nystagmus with equal extraocular motion bilaterally Neuro/Psych/Balance: Patient oriented to person, place, and time; Appropriate mood and affect; Gait is intact with no imbalance; Cranial nerves I-XII are intact Head and Face Inspection: Normocephalic and atraumatic without mass or lesion Palpation: Facial skeleton intact without bony stepoffs Salivary Glands: No mass or tenderness Facial Strength: Facial motility symmetric and full bilaterally ENT Pinna: External ear intact and fully developed External canal: Canal is patent with intact skin Tympanic Membrane: Clear and mobile External Nose: No scar or anatomic deformity Internal Nose: Septum is deviated to the left. No polyp, or purulence. Mucosal edema and erythema present.  Bilateral inferior turbinate hypertrophy.  Lips, Teeth, and gums: Mucosa and teeth intact and viable TMJ: No pain to palpation with full mobility Oral cavity/oropharynx: No erythema or  exudate, no lesions present Nasopharynx: No mass or lesion with intact mucosa Hypopharynx: Intact mucosa without pooling of secretions Larynx Glottic: Full true vocal cord mobility without lesion or mass see procedure note Supraglottic: Normal appearing epiglottis and AE folds Interarytenoid Space: Moderate pachydermia&edema Subglottic Space: Patent without lesion or edema Neck Neck and Trachea: Midline trachea without mass or lesion Thyroid : No mass or nodularity Lymphatics: No lymphadenopathy  Procedure:  Preoperative diagnosis: hoarseness  Postoperative diagnosis:   same  Procedure: Flexible fiberoptic laryngoscopy with stroboscopy (68420)   Surgeon: Elena Larry, MD  Anesthesia: Topical lidocaine  and Afrin  Complications: None  Condition is stable throughout exam  Indications and consent:   The patient presents to the clinic with hoarseness. All the risks, benefits, and potential complications were reviewed with the patient preoperatively and informed verbal consent was obtained.  Procedure: The patient was seated upright in the exam chair.   Topical lidocaine  and Afrin were applied to the nasal cavity. After adequate anesthesia had occurred, the flexible telescope with strobe capabilities was passed into the nasal cavity. The nasopharynx was patent without mass or lesion. The scope was passed behind the soft palate and directed toward the base of tongue. The base of tongue was visualized and was symmetric with no apparent masses or abnormal appearing tissue. There were no signs  of a mass or pooling of secretions in the piriform sinuses. The supraglottic structures were normal.  The true vocal cords are mobile. The medial edges were straight. Closure was incomplete with supraglottic compression. Periodicity present. The mucosal wave and amplitude were present, symmetric and normal. There is moderate interarytenoid pachydermia and post cricoid edema. The mucosa appears without  lesions.   The laryngoscope was then slowly withdrawn and the patient tolerated the procedure well. There were no complications or blood loss.  Studies Reviewed: CXR 10/20/23 FINDINGS: The heart size and mediastinal contours are within normal limits. Lung volumes are noted. Very mild atelectasis is seen within the left lung base. No acute infiltrate, pleural effusion or pneumothorax is identified. The visualized skeletal structures are unremarkable.   IMPRESSION: No active cardiopulmonary disease.  FNA procedure note 12/21/23 FINDINGS: Stable appearance to the predominantly solid nodule in the lower pole of the right thyroid  lobe.     Assessment/Plan: Encounter Diagnoses  Name Primary?   Glottic insufficiency    Dysphonia Yes   Hoarseness    Age-related vocal fold atrophy    Environmental and seasonal allergies    Post-nasal drip    Chronic nasal congestion    Multiple thyroid  nodules     Assessment and Plan Assessment & Plan Chronic dysphonia for over a year Chronic hoarseness due to age-related thinning of vocal cords noted on strobe exam, otherwise VF appear healthy without evidence of VF paralysis. SABRA - Initiated voice therapy. - Considered other interventions if sx fail to improve with speech therapy  - Referred to speech therapy.  GERD LPR and globus sensation  Likely contributing to sx - continue reflux medications  -  Reflux Gourmet after meals - diet and lifestyle changes to minimize GERD - Refer to Jamie Koufman's blog for dietary and lifestyle modifications/reflux cook book   Thank you for allowing me to participate in the care of this patient. Please do not hesitate to contact me with any questions or concerns.   Elena Larry, MD Otolaryngology [facility] Phone: [phone] Fax: [phone]    02/07/2024, 3:07 PM

## 2024-02-07 NOTE — Progress Notes (Signed)
 Meagan Molina

## 2024-03-05 NOTE — Progress Notes (Unsigned)
 Cardiology Office Note:  .   Date:  03/06/2024  ID:  [name], DOB 08-24-1950, MRN [id number] PCP: Suanne Pfeiffer, NP  [facility] Providers Cardiologist:  Darryle ONEIDA Decent, MD { History of Present Illness: .    Chief Complaint  Patient presents with   Follow-up    Meagan Molina is a 73 y.o. female with history of CHF, CAD, HTN, HLD, DM who presents for follow-up.   History of Present Illness   Meagan Molina is a 73 year old female with coronary artery disease who presents for follow-up after a myocardial infarction.  She has a history of coronary artery disease and experienced a myocardial infarction in 2005. A left heart catheterization in 2025 revealed a patent circumflex stent. Her ejection fraction is between 45% and 50%.  Her weight has stabilized at 173.2 pounds, an improvement from previous weights of 174 to 179 pounds, and significantly down from a high of 193 pounds. She is actively managing her weight and has resumed activities such as mowing her yard and helping at the barn.  She experiences shortness of breath and coughing upon overexertion, but these symptoms resolve with rest. No chest pain, dizziness, or lightheadedness.  Her current medications include aspirin , Jardiance  10 mg daily, metoprolol  succinate 12.5 mg daily, and Entresto  24/26 mg twice a day. She takes torsemide as needed and cannot take spironolactone due to a sulfa allergy.  She is also taking famotidine and Protonix for acid reflux, but reports no significant improvement in symptoms. Vocal cord therapy was suggested, but she has not pursued it further.          Problem List CAD -posterior MI 2005 with PCI 2005 -LHC 09/15/2023: patent LCX stent 2. Systolic HF -EF 45-50% 08/24/2023 3. HLD -T chol 146, HDL 49, LDL 74, TG 133 4. HTN 5. DM -A1c 6.5    ROS: All other ROS reviewed and negative. Pertinent positives noted in the HPI.     Studies Reviewed: SABRA       LHC/RHC  08/2023 Widely patent LCx stent with otherwise minimal CAD and relatively small caliber vessels. Mild Secondary Pulmonary Hypertension with a mean PAP of 28 mmHg, PCWP 9 mm agree, but LVEDP of 18 mmHg. Mildly reduced Fick Cardiac Output and Index: 4.85-2.57   TTE 08/24/2023  1. Possible subaortic membrane (see images 33 and 63). Left ventricular  ejection fraction, by estimation, is 45 to 50%. The left ventricle has  mildly decreased function. The left ventricle demonstrates regional wall  motion abnormalities (see scoring  diagram/findings for description). Left ventricular diastolic parameters  are indeterminate. There is hypokinesis of the left ventricular, basal-mid  inferolateral wall and inferior wall.   2. Right ventricular systolic function is normal. The right ventricular  size is normal.   3. The mitral valve is grossly normal. Trivial mitral valve  regurgitation. No evidence of mitral stenosis. Moderate mitral annular  calcification.   4. The aortic valve was not well visualized. There is mild calcification  of the aortic valve. Aortic valve regurgitation is not visualized. Aortic  valve sclerosis is present, with no evidence of aortic valve stenosis.   5. The inferior vena cava is normal in size with greater than 50%  respiratory variability, suggesting right atrial pressure of 3 mmHg.  Physical Exam:   VS:  BP 135/84 (BP Location: Right Arm, Patient Position: Sitting, Cuff Size: Normal)   Pulse 83   Resp 16   Ht 5' 4 (1.626 m)  Wt 173 lb 3.2 oz (78.6 kg)   SpO2 98%   BMI 29.73 kg/m    Wt Readings from Last 3 Encounters:  03/06/24 173 lb 3.2 oz (78.6 kg)  12/01/23 174 lb (78.9 kg)  11/01/23 175 lb (79.4 kg)    GEN: Well nourished, well developed in no acute distress NECK: No JVD; No carotid bruits CARDIAC: RRR, no murmurs, rubs, gallops RESPIRATORY:  Clear to auscultation without rales, wheezing or rhonchi  ABDOMEN: Soft, non-tender, non-distended EXTREMITIES:   No edema; No deformity  ASSESSMENT AND PLAN: .   Assessment and Plan    Heart failure with reduced ejection fraction, EF 45-50% Heart failure well-controlled with ejection fraction 45-50%. Sulfa allergy limits medication options. - Increase Entresto  to 49/51 mg BID. Instruct to take two 24/26 mg tablets in the morning and two at night until the new dose is available. - Continue Jardiance  10 mg daily. - Continue metoprolol  succinate 12.5 mg daily. - Continue torsemide as needed for fluid management. - Encourage regular physical activity and symptom monitoring.  Coronary artery disease, status post posterior myocardial infarction with prior circumflex stent No chest pain reported. - Continue aspirin  therapy. - continue statin              Follow-up: Return in about 6 months (around 09/06/2024).  Signed, Darryle DASEN. Barbaraann, MD, [facility]  [facility]  [address] [place], [place] [address] [phone]  1:50 PM

## 2024-03-06 ENCOUNTER — Encounter: Payer: Self-pay | Admitting: Cardiovascular Disease

## 2024-03-06 ENCOUNTER — Ambulatory Visit: Attending: Cardiovascular Disease | Admitting: Cardiovascular Disease

## 2024-03-06 VITALS — BP 135/84 | HR 83 | Resp 16 | Ht 64.0 in | Wt 173.2 lb

## 2024-03-06 DIAGNOSIS — I10 Essential (primary) hypertension: Secondary | ICD-10-CM | POA: Diagnosis not present

## 2024-03-06 DIAGNOSIS — E782 Mixed hyperlipidemia: Secondary | ICD-10-CM | POA: Insufficient documentation

## 2024-03-06 DIAGNOSIS — I2510 Atherosclerotic heart disease of native coronary artery without angina pectoris: Secondary | ICD-10-CM | POA: Insufficient documentation

## 2024-03-06 DIAGNOSIS — I5022 Chronic systolic (congestive) heart failure: Secondary | ICD-10-CM | POA: Insufficient documentation

## 2024-03-06 MED ORDER — SACUBITRIL-VALSARTAN 49-51 MG PO TABS: 1.0000 | ORAL_TABLET | Freq: Two times a day (BID) | ORAL | 3 refills | Status: AC

## 2024-03-06 NOTE — Patient Instructions (Addendum)
 Medication Instructions:  Your physician has recommended you make the following change in your medication:  INCREASE: Entresto  49-51 mg twice daily *If you need a refill on your cardiac medications before your next appointment, please call your pharmacy*   Follow-Up: At [facility], you and your health needs are our priority.  As part of our continuing mission to provide you with exceptional heart care, our providers are all part of one team.  This team includes your primary Cardiologist (physician) and Advanced Practice Providers or APPs (Physician Assistants and Nurse Practitioners) who all work together to provide you with the care you need, when you need it.  Your next appointment:   6 month(s)  Provider:   One of our Advanced Practice Providers (APPs): Morse Clause, PA-C  Lamarr Satterfield, NP Miriam Shams, NP  Olivia Pavy, PA-C Josefa Beauvais, NP  Leontine Salen, PA-C Orren Fabry, PA-C  [place], PA-C Ernest Dick, NP  Damien Braver, NP Jon Hails, PA-C  Waddell Donath, PA-C    Dayna Dunn, PA-C  Scott Weaver, PA-C Lum Louis, NP Katlyn West, NP Callie Goodrich, PA-C  Evan Williams, PA-C Sheng Haley, PA-C  Xika Zhao, NP Kathleen Johnson, PA-C   Then, Darryle ONEIDA Decent, MD will plan to see you again in 1 year(s).

## 2024-03-10 ENCOUNTER — Institutional Professional Consult (permissible substitution) (INDEPENDENT_AMBULATORY_CARE_PROVIDER_SITE_OTHER): Admitting: Otolaryngology

## 2024-07-24 ENCOUNTER — Other Ambulatory Visit: Payer: Self-pay | Admitting: Cardiovascular Disease

## 2024-09-18 ENCOUNTER — Ambulatory Visit: Admitting: Emergency Medicine

## 2024-10-18 ENCOUNTER — Ambulatory Visit: Admitting: Cardiovascular Disease
